# Patient Record
Sex: Female | Born: 1935 | Race: White | Hispanic: No | Smoking: Former smoker
Health system: Southern US, Community
[De-identification: ages and names within clinical notes are randomized; demographics above are authoritative.]

## PROBLEM LIST (undated history)

## (undated) DIAGNOSIS — C73 Malignant neoplasm of thyroid gland: Secondary | ICD-10-CM

## (undated) DIAGNOSIS — C50919 Malignant neoplasm of unspecified site of unspecified female breast: Secondary | ICD-10-CM

## (undated) HISTORY — PX: BREAST EXCISIONAL BIOPSY: SUR124

---

## 1990-08-25 DIAGNOSIS — C50919 Malignant neoplasm of unspecified site of unspecified female breast: Secondary | ICD-10-CM

## 1990-08-25 HISTORY — PX: MASTECTOMY: SHX3

## 1990-08-25 HISTORY — DX: Malignant neoplasm of unspecified site of unspecified female breast: C50.919

## 1998-12-19 ENCOUNTER — Ambulatory Visit (HOSPITAL_COMMUNITY): Admission: RE | Admit: 1998-12-19 | Discharge: 1998-12-19 | Payer: Self-pay | Admitting: Obstetrics & Gynecology

## 2004-10-23 ENCOUNTER — Ambulatory Visit: Payer: Self-pay | Admitting: General Surgery

## 2005-10-27 ENCOUNTER — Ambulatory Visit: Payer: Self-pay | Admitting: General Surgery

## 2006-09-15 ENCOUNTER — Other Ambulatory Visit: Payer: Self-pay

## 2006-09-15 ENCOUNTER — Ambulatory Visit: Payer: Self-pay | Admitting: Orthopaedic Surgery

## 2006-09-22 ENCOUNTER — Ambulatory Visit: Payer: Self-pay | Admitting: Orthopaedic Surgery

## 2006-10-28 ENCOUNTER — Ambulatory Visit: Payer: Self-pay | Admitting: General Surgery

## 2007-08-31 ENCOUNTER — Ambulatory Visit: Payer: Self-pay | Admitting: Unknown Physician Specialty

## 2007-10-29 ENCOUNTER — Ambulatory Visit: Payer: Self-pay | Admitting: General Surgery

## 2008-11-03 ENCOUNTER — Ambulatory Visit: Payer: Self-pay | Admitting: General Surgery

## 2009-10-17 ENCOUNTER — Ambulatory Visit: Payer: Self-pay | Admitting: Internal Medicine

## 2009-11-07 ENCOUNTER — Ambulatory Visit: Payer: Self-pay | Admitting: General Surgery

## 2009-12-05 ENCOUNTER — Ambulatory Visit: Payer: Self-pay | Admitting: Internal Medicine

## 2014-08-01 ENCOUNTER — Ambulatory Visit: Payer: Self-pay | Admitting: Family Medicine

## 2015-12-17 ENCOUNTER — Other Ambulatory Visit: Payer: Self-pay | Admitting: Family Medicine

## 2015-12-17 DIAGNOSIS — Z1231 Encounter for screening mammogram for malignant neoplasm of breast: Secondary | ICD-10-CM

## 2015-12-27 ENCOUNTER — Ambulatory Visit
Admission: RE | Admit: 2015-12-27 | Discharge: 2015-12-27 | Disposition: A | Discharge status: Home / Self Care | Payer: Medicare Other | Source: Ambulatory Visit | Attending: Family Medicine | Admitting: Family Medicine

## 2015-12-27 ENCOUNTER — Other Ambulatory Visit: Payer: Self-pay | Admitting: Family Medicine

## 2015-12-27 DIAGNOSIS — Z1231 Encounter for screening mammogram for malignant neoplasm of breast: Secondary | ICD-10-CM | POA: Diagnosis present

## 2015-12-27 HISTORY — DX: Malignant neoplasm of unspecified site of unspecified female breast: C50.919

## 2016-11-25 ENCOUNTER — Other Ambulatory Visit: Payer: Self-pay | Admitting: Family Medicine

## 2016-11-25 DIAGNOSIS — Z1231 Encounter for screening mammogram for malignant neoplasm of breast: Secondary | ICD-10-CM

## 2016-12-29 ENCOUNTER — Ambulatory Visit
Admission: RE | Admit: 2016-12-29 | Discharge: 2016-12-29 | Disposition: A | Discharge status: Home / Self Care | Payer: Medicare Other | Source: Ambulatory Visit | Attending: Family Medicine | Admitting: Family Medicine

## 2016-12-29 DIAGNOSIS — Z1231 Encounter for screening mammogram for malignant neoplasm of breast: Secondary | ICD-10-CM | POA: Diagnosis present

## 2018-02-15 ENCOUNTER — Other Ambulatory Visit: Payer: Self-pay | Admitting: Family Medicine

## 2018-02-15 DIAGNOSIS — Z1231 Encounter for screening mammogram for malignant neoplasm of breast: Secondary | ICD-10-CM

## 2018-03-12 ENCOUNTER — Ambulatory Visit
Admission: RE | Admit: 2018-03-12 | Discharge: 2018-03-12 | Disposition: A | Discharge status: Home / Self Care | Payer: Medicare Other | Source: Ambulatory Visit | Attending: Family Medicine | Admitting: Family Medicine

## 2018-03-12 DIAGNOSIS — Z1231 Encounter for screening mammogram for malignant neoplasm of breast: Secondary | ICD-10-CM | POA: Insufficient documentation

## 2018-05-18 ENCOUNTER — Ambulatory Visit: Payer: Medicare Other | Admitting: Internal Medicine

## 2019-02-22 ENCOUNTER — Other Ambulatory Visit: Payer: Self-pay | Admitting: Family Medicine

## 2019-02-22 DIAGNOSIS — Z1231 Encounter for screening mammogram for malignant neoplasm of breast: Secondary | ICD-10-CM

## 2019-04-04 ENCOUNTER — Other Ambulatory Visit: Payer: Self-pay

## 2019-04-04 ENCOUNTER — Ambulatory Visit
Admission: RE | Admit: 2019-04-04 | Discharge: 2019-04-04 | Disposition: A | Discharge status: Home / Self Care | Payer: Medicare Other | Source: Ambulatory Visit | Attending: Family Medicine | Admitting: Family Medicine

## 2019-04-04 DIAGNOSIS — Z1231 Encounter for screening mammogram for malignant neoplasm of breast: Secondary | ICD-10-CM | POA: Diagnosis present

## 2020-05-09 ENCOUNTER — Other Ambulatory Visit: Payer: Self-pay | Admitting: Family Medicine

## 2020-05-09 DIAGNOSIS — Z1231 Encounter for screening mammogram for malignant neoplasm of breast: Secondary | ICD-10-CM

## 2020-06-27 ENCOUNTER — Other Ambulatory Visit: Payer: Self-pay

## 2020-06-27 ENCOUNTER — Ambulatory Visit
Admission: RE | Admit: 2020-06-27 | Discharge: 2020-06-27 | Disposition: A | Discharge status: Home / Self Care | Payer: Medicare PPO | Source: Ambulatory Visit | Attending: Family Medicine | Admitting: Family Medicine

## 2020-06-27 DIAGNOSIS — Z1231 Encounter for screening mammogram for malignant neoplasm of breast: Secondary | ICD-10-CM | POA: Insufficient documentation

## 2021-06-11 ENCOUNTER — Other Ambulatory Visit: Payer: Self-pay | Admitting: Family Medicine

## 2021-06-11 DIAGNOSIS — Z1231 Encounter for screening mammogram for malignant neoplasm of breast: Secondary | ICD-10-CM

## 2021-07-03 ENCOUNTER — Other Ambulatory Visit: Payer: Self-pay

## 2021-07-03 ENCOUNTER — Ambulatory Visit
Admission: RE | Admit: 2021-07-03 | Discharge: 2021-07-03 | Disposition: A | Discharge status: Home / Self Care | Payer: Medicare PPO | Source: Ambulatory Visit | Attending: Family Medicine | Admitting: Family Medicine

## 2021-07-03 DIAGNOSIS — Z9011 Acquired absence of right breast and nipple: Secondary | ICD-10-CM | POA: Insufficient documentation

## 2021-07-03 DIAGNOSIS — Z1231 Encounter for screening mammogram for malignant neoplasm of breast: Secondary | ICD-10-CM | POA: Insufficient documentation

## 2021-07-03 DIAGNOSIS — Z853 Personal history of malignant neoplasm of breast: Secondary | ICD-10-CM | POA: Insufficient documentation

## 2021-09-18 ENCOUNTER — Encounter (INDEPENDENT_AMBULATORY_CARE_PROVIDER_SITE_OTHER): Payer: Self-pay

## 2021-09-25 ENCOUNTER — Other Ambulatory Visit: Payer: Self-pay

## 2021-09-25 ENCOUNTER — Ambulatory Visit (INDEPENDENT_AMBULATORY_CARE_PROVIDER_SITE_OTHER): Payer: Medicare PPO | Admitting: Ophthalmology

## 2021-09-25 ENCOUNTER — Encounter (INDEPENDENT_AMBULATORY_CARE_PROVIDER_SITE_OTHER): Payer: Self-pay | Admitting: Ophthalmology

## 2021-09-25 DIAGNOSIS — D3131 Benign neoplasm of right choroid: Secondary | ICD-10-CM

## 2021-09-25 DIAGNOSIS — H353131 Nonexudative age-related macular degeneration, bilateral, early dry stage: Secondary | ICD-10-CM | POA: Diagnosis not present

## 2021-09-25 NOTE — Progress Notes (Signed)
09/25/2021     CHIEF COMPLAINT Patient presents for  Chief Complaint  Patient presents with   Retina Evaluation    Evaluation for choroidal growth right eye  HISTORY OF PRESENT ILLNESS: Holly King is a 86 y.o. female who presents to the clinic today for:   HPI     Retina Evaluation           Laterality: left eye         Comments   NP- retinal consult, referred by Dr. Nelva Bush. Pt states "I had a check up for my Glaucoma and I mentioned I have noticed a change in vision. He said he saw a spot on my retina and he wanted me to have it checked out." Denies FOL or floaters. Pt uses latanoprost QHS OU.      Last edited by Laurin Coder on 09/25/2021  9:30 AM.      Referring physician: [place],  [place] [address]  HISTORICAL INFORMATION:   Selected notes from the [place]: No current outpatient medications on file. (Ophthalmic Drugs)   No current facility-administered medications for this visit. (Ophthalmic Drugs)   No current outpatient medications on file. (Other)   No current facility-administered medications for this visit. (Other)      REVIEW OF SYSTEMS:    ALLERGIES No Known Allergies  PAST MEDICAL HISTORY Past Medical History:  Diagnosis Date   Breast cancer ([place]) 1992   RIGHT MASTECTOMY   Past Surgical History:  Procedure Laterality Date   BREAST EXCISIONAL BIOPSY Left    NEG   MASTECTOMY Right 1992   BREAST CA    FAMILY HISTORY Family History  Problem Relation Age of Onset   Breast cancer Paternal Aunt     SOCIAL HISTORY Social History   Tobacco Use   Smoking status: Former    Types: Cigarettes    Passive exposure: Never   Smokeless tobacco: Never         OPHTHALMIC EXAM:  Base Eye Exam     Visual Acuity (ETDRS)       Right Left   Dist cc 20/40 -2 20/25 -1   Dist ph cc 20/30     Correction: Glasses          Tonometry (Tonopen, 9:33 AM)       Right Left   Pressure 14 19         Pupils       Pupils Dark Light Shape React APD   Right PERRL 4 3 Round Brisk None   Left PERRL 4 3 Round Brisk None         Visual Fields (Counting fingers)       Left Right    Full Full         Extraocular Movement       Right Left    Full Full         Neuro/Psych     Oriented x3: Yes   Mood/Affect: Normal         Dilation     Both eyes: 1.0% Mydriacyl, 2.5% Phenylephrine @ 9:33 AM           Slit Lamp and Fundus Exam     External Exam       Right Left   External Normal Normal         Slit Lamp Exam  Right Left   Lids/Lashes Normal Normal   Conjunctiva/Sclera White and quiet White and quiet   Cornea Clear Clear   Anterior Chamber Deep and quiet Deep and quiet   Iris Round and reactive Round and reactive   Lens Centered posterior chamber intraocular lens Centered posterior chamber intraocular lens   Anterior Vitreous Normal Normal         Fundus Exam       Right Left   Posterior Vitreous Normal Normal   Disc Normal Normal   C/D Ratio 0.35 0.35   Macula Normal Normal   Vessels Normal Normal   Periphery Amelanotic lesion superior at equator 12:00 meridian, approximately 4 x 3.5 DD diameter basal, with some thickness appreciable with 25 and 20 diopter lens evaluation Normal            IMAGING AND PROCEDURES  Imaging and Procedures for 09/25/21  OCT, Retina - OU - Both Eyes       Right Eye Quality was good. Scan locations included subfoveal. Central Foveal Thickness: 278. Progression has no prior data. Findings include retinal drusen .   Left Eye Quality was good. Scan locations included subfoveal. Central Foveal Thickness: 288. Progression has no prior data. Findings include retinal drusen .   Notes No active maculopathy in either eye     Color Fundus Photography Optos - OU - Both Eyes       Right Eye Progression has no prior data. Disc  findings include normal observations. Macula : normal observations. Vessels : normal observations.   Left Eye Progression has no prior data. Disc findings include normal observations. Macula : normal observations. Vessels : normal observations. Periphery : normal observations.   Notes OD, amelanotic lesion straddled the equator, approximately 4 x 4 disc diameters, no ring of atrophy no lipofuscin no vascularity seen on the surface of this area, possibly elevated     B-Scan Ultrasound - OD - Right Eye       Quality was good.   Notes B-scan ultrasound performed on lesion superiorly, no subretinal fluid  Thickness 1.54 mm             ASSESSMENT/PLAN:  Early stage nonexudative age-related macular degeneration of both eyes Minor OU, no active disease  Choroidal nevus of right eye Likely choroidal nevus of the right eye with no real high risk features, nonetheless patient does have a history of some 23 years before of breast cancer and we will monitor this closely for signs of growth and/or spread  Follow-up with Dr. Abran Richard off to have liver function test, chest x-ray and work-up to look for potential recurrence of that primary disease from over 20 years ago      ICD-10-CM   1. Choroidal nevus of right eye  D31.31 Color Fundus Photography Optos - OU - Both Eyes    B-Scan Ultrasound - OD - Right Eye    2. Early stage nonexudative age-related macular degeneration of both eyes  H35.3131 OCT, Retina - OU - Both Eyes      1.  OD likely choroidal nevus, new finding by Dr. Gwynn Burly.  We will follow this very closely because of the rare occurrences of high cellular doubling rates for rare tumors particularly primary ocular melanoma so thus not likely to happen also look for signs of change in the rare occurrence that this might be a new lesion from a distant primary tumor growth  2.  History of breast cancer over 20 years previously not likely to  be a recurrence but nonetheless needs  complete work-up  3.  Follow-up Dr. Abran Richard all for liver function tests, chest x-ray and appropriate studies to look for distant sites of involvement not likely to be found  Ophthalmic Meds Ordered this visit:  No orders of the defined types were placed in this encounter.      Return in about 8 weeks (around 11/20/2021) for dilate, OD, COLOR FP, OCT, B-Scan U/S.  There are no Patient Instructions on file for this visit.   Explained the diagnoses, plan, and follow up with the patient and they expressed understanding.  Patient expressed understanding of the importance of proper follow up care.   Holly King M.D. Diseases & Surgery of the Retina and Vitreous [facility] 09/25/21     Abbreviations: M myopia (nearsighted); A astigmatism; H hyperopia (farsighted); P presbyopia; Mrx spectacle prescription;  CTL contact lenses; OD right eye; OS left eye; OU both eyes  XT exotropia; ET esotropia; PEK punctate epithelial keratitis; PEE punctate epithelial erosions; DES dry eye syndrome; MGD meibomian gland dysfunction; ATs artificial tears; PFAT's preservative free artificial tears; [place] nuclear sclerotic cataract; PSC posterior subcapsular cataract; ERM epi-retinal membrane; PVD posterior vitreous detachment; RD retinal detachment; DM diabetes mellitus; DR diabetic retinopathy; NPDR non-proliferative diabetic retinopathy; PDR proliferative diabetic retinopathy; CSME clinically significant macular edema; DME diabetic macular edema; dbh dot blot hemorrhages; CWS cotton wool spot; POAG primary open angle glaucoma; C/D cup-to-disc ratio; HVF humphrey visual field; GVF goldmann visual field; OCT optical coherence tomography; IOP intraocular pressure; BRVO Branch retinal vein occlusion; CRVO central retinal vein occlusion; CRAO central retinal artery occlusion; BRAO branch retinal artery occlusion; RT retinal tear; SB scleral buckle; PPV pars plana vitrectomy; VH Vitreous hemorrhage; PRP  panretinal laser photocoagulation; IVK intravitreal kenalog; VMT vitreomacular traction; MH Macular hole;  NVD neovascularization of the disc; NVE neovascularization elsewhere; AREDS age related eye disease study; ARMD age related macular degeneration; POAG primary open angle glaucoma; EBMD epithelial/anterior basement membrane dystrophy; ACIOL anterior chamber intraocular lens; IOL intraocular lens; PCIOL posterior chamber intraocular lens; Phaco/IOL phacoemulsification with intraocular lens placement; [place] photorefractive keratectomy; LASIK laser assisted in situ keratomileusis; HTN hypertension; DM diabetes mellitus; COPD chronic obstructive pulmonary disease

## 2021-09-25 NOTE — Assessment & Plan Note (Signed)
Minor OU, no active disease

## 2021-09-25 NOTE — Assessment & Plan Note (Signed)
Likely choroidal nevus of the right eye with no real high risk features, nonetheless patient does have a history of some 23 years before of breast cancer and we will monitor this closely for signs of growth and/or spread  Follow-up with Dr. Abran Richard off to have liver function test, chest x-ray and work-up to look for potential recurrence of that primary disease from over 20 years ago

## 2021-11-13 LAB — COLOGUARD: COLOGUARD: POSITIVE — AB

## 2021-11-20 ENCOUNTER — Encounter (INDEPENDENT_AMBULATORY_CARE_PROVIDER_SITE_OTHER): Payer: Self-pay | Admitting: Ophthalmology

## 2021-11-20 ENCOUNTER — Ambulatory Visit (INDEPENDENT_AMBULATORY_CARE_PROVIDER_SITE_OTHER): Payer: Medicare PPO | Admitting: Ophthalmology

## 2021-11-20 ENCOUNTER — Other Ambulatory Visit: Payer: Self-pay

## 2021-11-20 DIAGNOSIS — D3131 Benign neoplasm of right choroid: Secondary | ICD-10-CM | POA: Diagnosis not present

## 2021-11-20 NOTE — Progress Notes (Signed)
? ? ?11/20/2021 ? ?  ? ?CHIEF COMPLAINT ?Patient presents for  ?Chief Complaint  ?Patient presents with  ? Retina Follow Up  ? ? ?For likely choroidal nevus superiorly OD nonetheless patient was sent for chest x-ray and liver function test to confirm no sign of spread of prior breast cancer disease ? ? ? ?HISTORY OF PRESENT ILLNESS: ?Holly King is a 86 y.o. female who presents to the clinic today for:  ? ?HPI   ? ? Retina Follow Up   ? ?      ? Diagnosis: Other (Choroidal Nevus )  ? Laterality: right eye  ? Onset: 8 weeks ago  ? Course: stable  ? ?  ?  ? ? Comments   ?8 weeks dilate OD, Color FP, OCT, B-Scan U/S. ?Pt states "vision is ok. It seems like it changes from day to day rather than being constant." Denies new FOL or floaters. ?Pt uses latanoprost QHS OU. ? ?  ?  ?Last edited by Laurin Coder on 11/20/2021 10:48 AM.  ?  ? ? ?Referring physician: ?Odette Fraction ?[address] ?[place],  [place] [address] ? ?HISTORICAL INFORMATION:  ? ?Selected notes from the [facility] ?  ?   ? ?CURRENT MEDICATIONS: ?Current Outpatient Medications (Ophthalmic Drugs)  ?Medication Sig  ? latanoprost (XALATAN) 0.005 % ophthalmic solution Place 1 drop into both eyes at bedtime.  ? ?No current facility-administered medications for this visit. (Ophthalmic Drugs)  ? ?No current outpatient medications on file. (Other)  ? ?No current facility-administered medications for this visit. (Other)  ? ? ? ? ?REVIEW OF SYSTEMS: ? ? ? ?ALLERGIES ?No Known Allergies ? ?PAST MEDICAL HISTORY ?Past Medical History:  ?Diagnosis Date  ? Breast cancer ([place]) 1992  ? RIGHT MASTECTOMY  ? ?Past Surgical History:  ?Procedure Laterality Date  ? BREAST EXCISIONAL BIOPSY Left   ? NEG  ? MASTECTOMY Right 1992  ? BREAST CA  ? ? ?FAMILY HISTORY ?Family History  ?Problem Relation Age of Onset  ? Breast cancer Paternal Aunt   ? ? ?SOCIAL HISTORY ?Social History  ? ?Tobacco Use  ? Smoking status: Former  ?  Types: Cigarettes  ?  Passive  exposure: Never  ? Smokeless tobacco: Never  ? ?  ? ?  ? ?OPHTHALMIC EXAM: ? ?Base Eye Exam   ? ? Visual Acuity (ETDRS)   ? ?   Right Left  ? Dist cc 20/25 -1 20/20 -2  ? ? Correction: Glasses  ? ?  ?  ? ? Tonometry (Tonopen, 10:51 AM)   ? ?   Right Left  ? Pressure 17 15  ? ?  ?  ? ? Pupils   ? ?   Pupils APD  ? Right PERRL None  ? Left PERRL None  ? ?  ?  ? ? Extraocular Movement   ? ?   Right Left  ?  Full Full  ? ?  ?  ? ? Neuro/Psych   ? ? Oriented x3: Yes  ? Mood/Affect: Normal  ? ?  ?  ? ? Dilation   ? ? Right eye: 1.0% Mydriacyl, 2.5% Phenylephrine @ 10:49 AM  ? ?  ?  ? ?  ? ?Slit Lamp and Fundus Exam   ? ? External Exam   ? ?   Right Left  ? External Normal Normal  ? ?  ?  ? ? Slit Lamp Exam   ? ?   Right Left  ?  Lids/Lashes Normal Normal  ? Conjunctiva/Sclera White and quiet White and quiet  ? Cornea Clear Clear  ? Anterior Chamber Deep and quiet Deep and quiet  ? Iris Round and reactive Round and reactive  ? Lens Centered posterior chamber intraocular lens Centered posterior chamber intraocular lens  ? Anterior Vitreous Normal Normal  ? ?  ?  ? ? Fundus Exam   ? ?   Right Left  ? Posterior Vitreous Normal Normal  ? Disc Normal Normal  ? C/D Ratio 0.35 0.35  ? Macula Normal Normal  ? Vessels Normal Normal  ? Periphery Amelanotic lesion superior at equator 12:00 meridian, approximately 4 x 3.5 DD diameter basal, with some thickness appreciable with 25 and 20 diopter lens evaluation Normal  ? ?  ?  ? ?  ? ? ?IMAGING AND PROCEDURES  ?Imaging and Procedures for 11/20/21 ? ?Color Fundus Photography Optos - OU - Both Eyes   ? ?   ?Right Eye ?Progression has no prior data. Disc findings include normal observations. Macula : normal observations. Vessels : normal observations.  ? ?Left Eye ?Progression has no prior data. Disc findings include normal observations. Macula : normal observations. Vessels : normal observations. Periphery : normal observations.  ? ?Notes ?OD, amelanotic lesion straddled the equator  superiorly, approximately 4 x 4 disc diameters, no ring of atrophy no lipofuscin no vascularity seen on the surface of this area, possibly elevated ? ?  ? ? ?  ?  ? ?  ?ASSESSMENT/PLAN: ? ?Choroidal nevus of right eye ?OD no interval change in appearance to this mostly amelanotic lesion which does appear to be a choroidal nevus in the itself has no high risk factors ? ?However patient does have a history of breast cancer.  Patient was sent for chest x-ray ? ?This was performed on October 30, 2021 at the [facility] sensitive out of [place] and my review of epic chart on this date, 11/20/2021 that "result is not available" ? ?Labs are available in view reviewable in the liver function tests were normal ? ?  ? ?  ICD-10-CM   ?1. Choroidal nevus of right eye  D31.31 Color Fundus Photography Optos - OU - Both Eyes  ?  CANCELED: OCT, Retina - OU - Both Eyes  ?  ? ? ?1.  We will attempt to follow-up on the chest x-ray results.  Both the patient and I will make efforts to follow-up with those results.  She will call her physician for those results and I will continue to look in the computer for such results for the chest x-ray. ? ?2.  Patient agrees she will did make her best efforts to contact her physician about the results as I also continue to monitor the computer for the result ? ?3.  Not likely at this is a and related to her prior breast cancer from 23 years previous ? ?Ophthalmic Meds Ordered this visit:  ?No orders of the defined types were placed in this encounter. ? ? ?  ? ?Return in about 6 months (around 05/23/2022) for dilate, OD, COLOR FP, B-Scan U/S. ? ?There are no Patient Instructions on file for this visit. ? ? ?Explained the diagnoses, plan, and follow up with the patient and they expressed understanding.  Patient expressed understanding of the importance of proper follow up care.  ? ?Clent Demark. Ainara Eldridge M.D. ?Diseases & Surgery of the Retina and Vitreous ?[place] ?11/20/21 ? ? ? ? ?Abbreviations: ?M myopia (  nearsighted); A astigmatism; H hyperopia (farsighted); P presbyopia; Mrx spectacle prescription;  CTL contact lenses; OD right eye; OS left eye; OU both eyes  XT exotropia; ET esotropia; PEK punctate epithelial keratitis; PEE punctate epithelial erosions; DES dry eye syndrome; MGD meibomian gland dysfunction; ATs artificial tears; PFAT's preservative free artificial tears; [place] nuclear sclerotic cataract; PSC posterior subcapsular cataract; ERM epi-retinal membrane; PVD posterior vitreous detachment; RD retinal detachment; DM diabetes mellitus; DR diabetic retinopathy; NPDR non-proliferative diabetic retinopathy; PDR proliferative diabetic retinopathy; CSME clinically significant macular edema; DME diabetic macular edema; dbh dot blot hemorrhages; CWS cotton wool spot; POAG primary open angle glaucoma; C/D cup-to-disc ratio; HVF humphrey visual field; GVF goldmann visual field; OCT optical coherence tomography; IOP intraocular pressure; BRVO Branch retinal vein occlusion; CRVO central retinal vein occlusion; CRAO central retinal artery occlusion; BRAO branch retinal artery occlusion; RT retinal tear; SB scleral buckle; PPV pars plana vitrectomy; VH Vitreous hemorrhage; PRP panretinal laser photocoagulation; IVK intravitreal kenalog; VMT vitreomacular traction; MH Macular hole;  NVD neovascularization of the disc; NVE neovascularization elsewhere; AREDS age related eye disease study; ARMD age related macular degeneration; POAG primary open angle glaucoma; EBMD epithelial/anterior basement membrane dystrophy; ACIOL anterior chamber intraocular lens; IOL intraocular lens; PCIOL posterior chamber intraocular lens; Phaco/IOL phacoemulsification with intraocular lens placement; [place] photorefractive keratectomy; LASIK laser assisted in situ keratomileusis; HTN hypertension; DM diabetes mellitus; COPD chronic obstructive pulmonary disease ?

## 2021-11-20 NOTE — Assessment & Plan Note (Signed)
OD no interval change in appearance to this mostly amelanotic lesion which does appear to be a choroidal nevus in the itself has no high risk factors ? ?However patient does have a history of breast cancer.  Patient was sent for chest x-ray ? ?This was performed on October 30, 2021 at the [facility] sensitive out of [place] and my review of epic chart on this date, 11/20/2021 that "result is not available" ? ?Labs are available in view reviewable in the liver function tests were normal ? ? ?

## 2022-04-07 ENCOUNTER — Ambulatory Visit: Payer: Self-pay | Admitting: Urology

## 2022-04-23 ENCOUNTER — Ambulatory Visit: Admit: 2022-04-23 | Payer: Medicare PPO | Admitting: Internal Medicine

## 2022-04-23 SURGERY — COLONOSCOPY
Anesthesia: General

## 2022-05-19 ENCOUNTER — Ambulatory Visit: Payer: Medicare PPO | Admitting: Urology

## 2022-05-19 VITALS — BP 213/74 | HR 80 | Ht 59.0 in | Wt 121.6 lb

## 2022-05-19 DIAGNOSIS — N3946 Mixed incontinence: Secondary | ICD-10-CM | POA: Diagnosis not present

## 2022-05-19 DIAGNOSIS — N3281 Overactive bladder: Secondary | ICD-10-CM | POA: Diagnosis not present

## 2022-05-19 DIAGNOSIS — N3941 Urge incontinence: Secondary | ICD-10-CM

## 2022-05-19 LAB — MICROSCOPIC EXAMINATION: Bacteria, UA: NONE SEEN

## 2022-05-19 LAB — URINALYSIS, COMPLETE
Bilirubin, UA: NEGATIVE
Glucose, UA: NEGATIVE
Ketones, UA: NEGATIVE
Leukocytes,UA: NEGATIVE
Nitrite, UA: NEGATIVE
Protein,UA: NEGATIVE
Specific Gravity, UA: 1.01 (ref 1.005–1.030)
Urobilinogen, Ur: 0.2 mg/dL (ref 0.2–1.0)
pH, UA: 5.5 (ref 5.0–7.5)

## 2022-05-19 MED ORDER — GEMTESA 75 MG PO TABS: 1.0000 | ORAL_TABLET | ORAL | 0 refills | Status: AC

## 2022-05-19 NOTE — Progress Notes (Signed)
   05/19/2022 9:18 AM   Holly King 04/05/1936 [id number]  Referring provider: Derinda Late, MD [phone] [facility] [place],  [place] [address]  Chief Complaint  Patient presents with   Urinary Incontinence    HPI: I was consulted to assess the patient's urinary incontinence.  She has urge incontinence.  She can leak with coughing sneezing and bending and lifting.  No bedwetting.  She wears 2 pads a day with a varying amount of leakage.  She can void every 20 minutes to 2 hours gets up once or twice a night reports a good flow  She has failed Myrbetriq and oxybutynin.  No hysterectomy  No history of kidney stones bladder surgery or bladder infections.  No neurologic issues.  Bowel movements normal.   PMH: Past Medical History:  Diagnosis Date   Breast cancer ([place]) 1992   RIGHT MASTECTOMY    Surgical History: Past Surgical History:  Procedure Laterality Date   BREAST EXCISIONAL BIOPSY Left    NEG   MASTECTOMY Right 1992   BREAST CA    Home Medications:  Allergies as of 05/19/2022   No Known Allergies      Medication List        Accurate as of May 19, 2022  9:18 AM. If you have any questions, ask your nurse or doctor.          latanoprost 0.005 % ophthalmic solution Commonly known as: XALATAN Place 1 drop into both eyes at bedtime.        Allergies: No Known Allergies  Family History: Family History  Problem Relation Age of Onset   Breast cancer Paternal Aunt     Social History:  reports that she has quit smoking. Her smoking use included cigarettes. She has never been exposed to tobacco smoke. She has never used smokeless tobacco. No history on file for alcohol use and drug use.  ROS:                                        Physical Exam: There were no vitals taken for this visit.  Constitutional:  Alert and oriented, No acute distress. HEENT: [place] AT,  moist mucus membranes.  Trachea midline, no masses.   Laboratory Data: No results found for: "WBC", "HGB", "HCT", "MCV", "PLT"  No results found for: "CREATININE"  No results found for: "PSA"  No results found for: "TESTOSTERONE"  No results found for: "HGBA1C"  Urinalysis No results found for: "COLORURINE", "APPEARANCEUR", "LABSPEC", "PHURINE", "GLUCOSEU", "HGBUR", "BILIRUBINUR", "KETONESUR", "PROTEINUR", "UROBILINOGEN", "NITRITE", "LEUKOCYTESUR"  Pertinent Imaging: Urine reviewed.  Urine sent for culture.  Chart reviewed  Assessment & Plan: Patient has mixed incontinence and primarily overactive bladder.  I would like to try Gemtesa 30x11 have her come back for cystoscopy.  She understands I would be ordering urodynamics if this fails.  I explained urodynamics to her today  1. Urge incontinence  - Urinalysis, Complete   No follow-ups on file.  Reece Packer, MD  [facility] [address] [place], [place] [address] [phone]

## 2022-05-22 LAB — CULTURE, URINE COMPREHENSIVE

## 2022-05-26 ENCOUNTER — Encounter (INDEPENDENT_AMBULATORY_CARE_PROVIDER_SITE_OTHER): Payer: Medicare PPO | Admitting: Ophthalmology

## 2022-06-02 ENCOUNTER — Encounter (INDEPENDENT_AMBULATORY_CARE_PROVIDER_SITE_OTHER): Payer: Medicare PPO | Admitting: Ophthalmology

## 2022-06-04 ENCOUNTER — Other Ambulatory Visit: Payer: Self-pay | Admitting: Family Medicine

## 2022-06-04 DIAGNOSIS — Z1231 Encounter for screening mammogram for malignant neoplasm of breast: Secondary | ICD-10-CM

## 2022-06-09 ENCOUNTER — Encounter (INDEPENDENT_AMBULATORY_CARE_PROVIDER_SITE_OTHER): Payer: Medicare PPO | Admitting: Ophthalmology

## 2022-06-30 ENCOUNTER — Ambulatory Visit: Payer: Medicare PPO | Admitting: Urology

## 2022-06-30 ENCOUNTER — Encounter: Payer: Self-pay | Admitting: Urology

## 2022-06-30 VITALS — BP 205/112 | HR 53 | Ht 59.0 in | Wt 125.0 lb

## 2022-06-30 DIAGNOSIS — R3129 Other microscopic hematuria: Secondary | ICD-10-CM

## 2022-06-30 DIAGNOSIS — N3941 Urge incontinence: Secondary | ICD-10-CM

## 2022-06-30 LAB — URINALYSIS, COMPLETE
Bilirubin, UA: NEGATIVE
Glucose, UA: NEGATIVE
Ketones, UA: NEGATIVE
Leukocytes,UA: NEGATIVE
Nitrite, UA: NEGATIVE
Protein,UA: NEGATIVE
Specific Gravity, UA: 1.01 (ref 1.005–1.030)
Urobilinogen, Ur: 0.2 mg/dL (ref 0.2–1.0)
pH, UA: 6 (ref 5.0–7.5)

## 2022-06-30 LAB — MICROSCOPIC EXAMINATION

## 2022-06-30 MED ORDER — GEMTESA 75 MG PO TABS: 75.0000 mg | ORAL_TABLET | Freq: Every day | ORAL | 3 refills | Status: DC

## 2022-06-30 NOTE — Progress Notes (Signed)
06/30/2022 9:07 AM   Galvin Proffer September 06, 1935 [id number]  Referring provider: Derinda Late, MD [phone] [facility] [place],  [place] [address]  Chief Complaint  Patient presents with   Urinary Incontinence    HPI: I was consulted to assess the patient's urinary incontinence.  She has urge incontinence.  She can leak with coughing sneezing and bending and lifting.  No bedwetting.  She wears 2 pads a day with a varying amount of leakage.   She can void every 20 minutes to 2 hours gets up once or twice a night reports a good flow   She has failed Myrbetriq and oxybutynin.   No hysterectomy    Patient has mixed incontinence and primarily overactive bladder.  I would like to try Gemtesa 30x11 have her come back for cystoscopy.  She understands I would be ordering urodynamics if this fails.  I explained urodynamics to her today   Today Frequency stable.  Last culture negative.  History was more challenging.  I think the [place] helps but she still has a lot of her urge incontinence.  She has never smoked.  No daily aspirin or blood thinner  Urinalysis positive red blood cells and sent for culture.  On pelvic examination she grade 2 hypermobility bladder neck and negative cough test after cystoscopy with a moderate cough  Cystoscopy: Patient underwent flexible cystoscopy.  Bladder mucosa and trigone were normal.  No carcinoma.  No cystitis  PMH: Past Medical History:  Diagnosis Date   Breast cancer ([place]) 1992   RIGHT MASTECTOMY    Surgical History: Past Surgical History:  Procedure Laterality Date   BREAST EXCISIONAL BIOPSY Left    NEG   MASTECTOMY Right 1992   BREAST CA    Home Medications:  Allergies as of 06/30/2022       Reactions   Alendronate Nausea Only        Medication List        Accurate as of June 30, 2022  9:07 AM. If you have any questions, ask your nurse or doctor.           amLODipine 2.5 MG tablet Commonly known as: NORVASC Take 2.5 mg by mouth daily.   gabapentin 300 MG capsule Commonly known as: NEURONTIN Take 1 capsule by mouth at bedtime.   latanoprost 0.005 % ophthalmic solution Commonly known as: XALATAN Place 1 drop into both eyes at bedtime.   levothyroxine 88 MCG tablet Commonly known as: SYNTHROID Take by mouth.   Myrbetriq 25 MG Tb24 tablet Generic drug: mirabegron ER Take by mouth.   TRAZODONE & DIET MANAGE PROD PO        Allergies:  Allergies  Allergen Reactions   Alendronate Nausea Only    Family History: Family History  Problem Relation Age of Onset   Breast cancer Paternal Aunt     Social History:  reports that she has quit smoking. Her smoking use included cigarettes. She has never been exposed to tobacco smoke. She has never used smokeless tobacco. No history on file for alcohol use and drug use.  ROS:                                        Physical Exam: There were no vitals taken for this visit.  Constitutional:  Alert and oriented, No acute distress.   Laboratory  Data: No results found for: "WBC", "HGB", "HCT", "MCV", "PLT"  No results found for: "CREATININE"  No results found for: "PSA"  No results found for: "TESTOSTERONE"  No results found for: "HGBA1C"  Urinalysis    Component Value Date/Time   APPEARANCEUR Clear 05/19/2022 0922   GLUCOSEU Negative 05/19/2022 0922   BILIRUBINUR Negative 05/19/2022 0922   PROTEINUR Negative 05/19/2022 0922   NITRITE Negative 05/19/2022 0922   LEUKOCYTESUR Negative 05/19/2022 0922    Pertinent Imaging: Urine reviewed.  Urine sent for culture.  Assessment & Plan: Patient has mixed incontinence and is a partial responder to [place]  She will return with urodynamics and we will proceed accordingly.  I will call if CT scan is abnormal for work-up of microscopic hematuria.  Call if urine culture is positive  1. Urge  incontinence  - Urinalysis, Complete   No follow-ups on file.  Reece Packer, MD  [facility] [address] [place], [place] [address] [phone]

## 2022-07-03 LAB — CULTURE, URINE COMPREHENSIVE

## 2022-07-03 NOTE — Addendum Note (Signed)
Addended by: Despina Hidden on: 07/03/2022 09:41 AM   Modules accepted: Orders

## 2022-07-04 ENCOUNTER — Ambulatory Visit
Admission: RE | Admit: 2022-07-04 | Discharge: 2022-07-04 | Disposition: A | Discharge status: Home / Self Care | Payer: Medicare PPO | Source: Ambulatory Visit | Attending: Family Medicine | Admitting: Family Medicine

## 2022-07-04 DIAGNOSIS — Z1231 Encounter for screening mammogram for malignant neoplasm of breast: Secondary | ICD-10-CM | POA: Diagnosis present

## 2022-07-30 ENCOUNTER — Ambulatory Visit: Payer: Medicare PPO | Admitting: Anesthesiology

## 2022-07-30 ENCOUNTER — Encounter: Payer: Self-pay | Admitting: Internal Medicine

## 2022-07-30 ENCOUNTER — Ambulatory Visit
Admission: RE | Admit: 2022-07-30 | Discharge: 2022-07-30 | Disposition: A | Discharge status: Home / Self Care | Payer: Medicare PPO | Attending: Internal Medicine | Admitting: Internal Medicine

## 2022-07-30 ENCOUNTER — Encounter: Payer: Self-pay | Admitting: Urology

## 2022-07-30 ENCOUNTER — Other Ambulatory Visit: Payer: Self-pay

## 2022-07-30 ENCOUNTER — Encounter
Admission: RE | Disposition: A | Discharge status: Home / Self Care | Payer: Self-pay | Source: Home / Self Care | Attending: Internal Medicine

## 2022-07-30 DIAGNOSIS — K640 First degree hemorrhoids: Secondary | ICD-10-CM | POA: Insufficient documentation

## 2022-07-30 DIAGNOSIS — K5730 Diverticulosis of large intestine without perforation or abscess without bleeding: Secondary | ICD-10-CM | POA: Diagnosis not present

## 2022-07-30 DIAGNOSIS — Z1211 Encounter for screening for malignant neoplasm of colon: Secondary | ICD-10-CM | POA: Diagnosis present

## 2022-07-30 DIAGNOSIS — Q438 Other specified congenital malformations of intestine: Secondary | ICD-10-CM | POA: Diagnosis not present

## 2022-07-30 DIAGNOSIS — R195 Other fecal abnormalities: Secondary | ICD-10-CM | POA: Diagnosis not present

## 2022-07-30 DIAGNOSIS — D175 Benign lipomatous neoplasm of intra-abdominal organs: Secondary | ICD-10-CM | POA: Insufficient documentation

## 2022-07-30 HISTORY — PX: COLONOSCOPY WITH PROPOFOL: SHX5780

## 2022-07-30 HISTORY — DX: Malignant neoplasm of thyroid gland: C73

## 2022-07-30 SURGERY — COLONOSCOPY WITH PROPOFOL
Anesthesia: General

## 2022-07-30 MED ORDER — SODIUM CHLORIDE 0.9 % IV SOLN: INTRAVENOUS | Status: DC

## 2022-07-30 MED ORDER — LIDOCAINE HCL (PF) 2 % IJ SOLN
INTRAMUSCULAR | Status: AC
  Filled 2022-07-30: qty 5

## 2022-07-30 MED ORDER — LIDOCAINE HCL (CARDIAC) PF 100 MG/5ML IV SOSY
PREFILLED_SYRINGE | INTRAVENOUS | Status: DC | PRN
  Administered 2022-07-30: 50 mg via INTRAVENOUS

## 2022-07-30 MED ORDER — HYDRALAZINE HCL 20 MG/ML IJ SOLN
INTRAMUSCULAR | Status: AC
  Filled 2022-07-30: qty 1

## 2022-07-30 MED ORDER — HYDRALAZINE HCL 20 MG/ML IJ SOLN
INTRAMUSCULAR | Status: DC | PRN
  Administered 2022-07-30: 5 mg via INTRAVENOUS

## 2022-07-30 MED ORDER — PROPOFOL 500 MG/50ML IV EMUL
INTRAVENOUS | Status: DC | PRN
  Administered 2022-07-30: 75 ug/kg/min via INTRAVENOUS

## 2022-07-30 MED ORDER — PROPOFOL 10 MG/ML IV BOLUS
INTRAVENOUS | Status: DC | PRN
  Administered 2022-07-30 (×2): 20 mg via INTRAVENOUS
  Administered 2022-07-30: 10 mg via INTRAVENOUS

## 2022-07-30 NOTE — H&P (Signed)
Outpatient short stay form Pre-procedure 07/30/2022 8:51 AM Holly King, M.D.  Primary Physician: Derinda Late, M.D.  Reason for visit:  Positive fecal dna test (Cologuard test).  History of present illness:  Holly King is a pleasant 86 year old female presenting with her daughter, Holly King, for gastroenterology consultation requested by Dr. Baldemar Lenis for positive fecal DNA test. Besides mild intermittent diarrhea with "loose stools", patient bowel frequency is only about 1/day. There are no nocturnal stools and no fecal urgency or incontinence. Patient denies any hematochezia. Patient has mild diffuse abdominal bloating without frank abdominal pain. Appetite is normal and weight is normal. Denies any significant gastroesophageal reflux symptoms such as waterbrash, retrosternal burning or regurgitation. Patient denies any anorexia symptoms. Patient's last colonoscopy was performed on 08/31/2007 by Dr. Gaylyn Cheers revealing sigmoid diverticulosis and internal hemorrhoids.    No current facility-administered medications for this encounter.  Medications Prior to Admission  Medication Sig Dispense Refill Last Dose   amLODipine (NORVASC) 2.5 MG tablet Take 2.5 mg by mouth daily.      gabapentin (NEURONTIN) 300 MG capsule Take 1 capsule by mouth at bedtime.      latanoprost (XALATAN) 0.005 % ophthalmic solution Place 1 drop into both eyes at bedtime.      levothyroxine (SYNTHROID) 88 MCG tablet Take by mouth.      TRAZODONE & DIET MANAGE PROD PO       Vibegron (GEMTESA) 75 MG TABS Take 75 mg by mouth daily. 30 tablet 3      Allergies  Allergen Reactions   Alendronate Nausea Only     Past Medical History:  Diagnosis Date   Breast cancer ([place]) 1992   RIGHT MASTECTOMY    Review of systems:  Otherwise negative.    Physical Exam  Gen: Alert, oriented. Appears stated age.  HEENT: [place]/AT. PERRLA. Lungs: CTA, no wheezes. CV: RR nl S1, S2. Abd: soft, benign, no masses. BS+ Ext:  No edema. Pulses 2+    Planned procedures: Proceed with colonoscopy. The patient understands the nature of the planned procedure, indications, risks, alternatives and potential complications including but not limited to bleeding, infection, perforation, damage to internal organs and possible oversedation/side effects from anesthesia. The patient agrees and gives consent to proceed.  Please refer to procedure notes for findings, recommendations and patient disposition/instructions.     Holly King, M.D. Gastroenterology 07/30/2022  8:51 AM

## 2022-07-30 NOTE — Anesthesia Postprocedure Evaluation (Signed)
Anesthesia Post Note  Patient: Holly King  Procedure(s) Performed: COLONOSCOPY WITH PROPOFOL  Patient location during evaluation: Endoscopy Anesthesia Type: General Level of consciousness: awake and alert Pain management: pain level controlled Vital Signs Assessment: post-procedure vital signs reviewed and stable Respiratory status: spontaneous breathing, nonlabored ventilation, respiratory function stable and patient connected to nasal cannula oxygen Cardiovascular status: blood pressure returned to baseline and stable Postop Assessment: no apparent nausea or vomiting Anesthetic complications: no  No notable events documented.   Last Vitals:  Vitals:   07/30/22 0958 07/30/22 1008  BP: 130/71 (!) 184/87  Pulse: (!) 50 64  Resp: (!) 21 16  Temp: (!) 36.2 C   SpO2: 100% 100%    Last Pain:  Vitals:   07/30/22 1008  TempSrc:   PainSc: 0-No pain                 Ilene Qua

## 2022-07-30 NOTE — Op Note (Signed)
[facility] Gastroenterology Patient Name: Holly King Procedure Date: 07/30/2022 9:31 AM MRN: [id number] Account #: [id number] Date of Birth: Dec 07, 1935 Admit Type: Outpatient Age: 86 Room: [facility] ENDO ROOM 2 Gender: Female Note Status: Finalized Instrument Name: Colonoscope [id number] Procedure:             Colonoscopy Indications:           Positive Cologuard test Providers:             Benay Pike. Alice Reichert MD, MD Referring MD:          Caprice Renshaw MD (Referring MD) Medicines:             Propofol per Anesthesia Complications:         No immediate complications. Procedure:             Pre-Anesthesia Assessment:                        - The risks and benefits of the procedure and the                         sedation options and risks were discussed with the                         patient. All questions were answered and informed                         consent was obtained.                        - Patient identification and proposed procedure were                         verified prior to the procedure by the nurse. The                         procedure was verified in the procedure room.                        - ASA Grade Assessment: III - A patient with severe                         systemic disease.                        - After reviewing the risks and benefits, the patient                         was deemed in satisfactory condition to undergo the                         procedure.                        After obtaining informed consent, the colonoscope was                         passed under direct vision. Throughout the procedure,                         the patient's  blood pressure, pulse, and oxygen                         saturations were monitored continuously. The                         Colonoscope was introduced through the anus and                         advanced to the the cecum, identified by appendiceal                          orifice and ileocecal valve. The colonoscopy was                         somewhat difficult due to a redundant colon.                         Successful completion of the procedure was aided by                         straightening and shortening the scope to obtain bowel                         loop reduction. The patient tolerated the procedure                         well. The quality of the bowel preparation was good.                         The ileocecal valve, appendiceal orifice, and rectum                         were photographed. Findings:      The perianal and digital rectal examinations were normal. Pertinent       negatives include normal sphincter tone and no palpable rectal lesions.      Non-bleeding internal hemorrhoids were found during retroflexion. The       hemorrhoids were Grade I (internal hemorrhoids that do not prolapse).      Multiple diverticula were found in the sigmoid colon. There was no       evidence of diverticular bleeding.      There was a small lipoma, in the cecum. Biopsies were taken with a cold       forceps for histology.      The exam was otherwise without abnormality. Impression:            - Non-bleeding internal hemorrhoids.                        - Moderate diverticulosis in the sigmoid colon. There                         was no evidence of diverticular bleeding.                        - Small lipoma in the cecum. Biopsied.                        -  The examination was otherwise normal. Recommendation:        - Patient has a contact number available for                         emergencies. The signs and symptoms of potential                         delayed complications were discussed with the patient.                         Return to normal activities tomorrow. Written                         discharge instructions were provided to the patient.                        - Resume previous diet.                        - Continue present  medications.                        - Await pathology results.                        - No repeat colonoscopy due to current age (44 years                         or older) and the absence of advanced adenomas.                        - Return to GI office PRN.                        - You do NOT require further colon cancer screening                         measures (Annual stool testing (i.e. hemoccult, FIT,                         cologuard), sigmoidoscopy, colonoscopy or CT                         colonography). You should share this recommendation                         with your Primary Care provider.                        - The findings and recommendations were discussed with                         the patient. Procedure Code(s):     --- Professional ---                        [phone], Colonoscopy, flexible; with biopsy, single or                         multiple Diagnosis Code(s):     --- Professional ---  K57.30, Diverticulosis of large intestine without                         perforation or abscess without bleeding                        R19.5, Other fecal abnormalities                        D17.5, Benign lipomatous neoplasm of intra-abdominal                         organs                        K64.0, First degree hemorrhoids CPT copyright 2022 American Medical Association. All rights reserved. The codes documented in this report are preliminary and upon coder review may  be revised to meet current compliance requirements. Efrain Sella MD, MD 07/30/2022 10:01:08 AM This report has been signed electronically. Number of Addenda: 0 Note Initiated On: 07/30/2022 9:31 AM Scope Withdrawal Time: 0 hours 5 minutes 13 seconds  Total Procedure Duration: 0 hours 13 minutes 43 seconds  Estimated Blood Loss:  Estimated blood loss: none.      [facility]

## 2022-07-30 NOTE — Transfer of Care (Signed)
Immediate Anesthesia Transfer of Care Note  Patient: Tailey Top  Procedure(s) Performed: COLONOSCOPY WITH PROPOFOL  Patient Location: PACU  Anesthesia Type:General  Level of Consciousness: sedated  Airway & Oxygen Therapy: Patient Spontanous Breathing  Post-op Assessment: Report given to RN and Post -op Vital signs reviewed and stable  Post vital signs: Reviewed and stable  Last Vitals:  Vitals Value Taken Time  BP 130/71 07/30/22 0958  Temp 36.2 C 07/30/22 0958  Pulse 49 07/30/22 0958  Resp 19 07/30/22 0958  SpO2 100 % 07/30/22 0958  Vitals shown include unvalidated device data.  Last Pain:  Vitals:   07/30/22 0958  TempSrc: Temporal  PainSc: Asleep         Complications: No notable events documented.

## 2022-07-30 NOTE — Anesthesia Preprocedure Evaluation (Signed)
Anesthesia Evaluation  Patient identified by MRN, date of birth, ID band Patient awake    Reviewed: Allergy & Precautions, NPO status , Patient's Chart, lab work & pertinent test results  History of Anesthesia Complications Negative for: history of anesthetic complications  Airway Mallampati: II  TM Distance: >3 FB Neck ROM: full    Dental  (+) Teeth Intact   Pulmonary neg pulmonary ROS, former smoker   Pulmonary exam normal        Cardiovascular hypertension, On Medications Normal cardiovascular exam     Neuro/Psych negative neurological ROS  negative psych ROS   GI/Hepatic negative GI ROS, Neg liver ROS,,,  Endo/Other  Hypothyroidism    Renal/GU negative Renal ROS  negative genitourinary   Musculoskeletal   Abdominal   Peds  Hematology negative hematology ROS (+)   Anesthesia Other Findings Past Medical History: 1992: Breast cancer ([place])     Comment:  RIGHT MASTECTOMY  Past Surgical History: No date: BREAST EXCISIONAL BIOPSY; Left     Comment:  NEG 1992: MASTECTOMY; Right     Comment:  BREAST CA     Reproductive/Obstetrics negative OB ROS                              Anesthesia Physical Anesthesia Plan  ASA: 2  Anesthesia Plan: General   Post-op Pain Management: Minimal or no pain anticipated   Induction:   PONV Risk Score and Plan: Propofol infusion and TIVA  Airway Management Planned:   Additional Equipment:   Intra-op Plan:   Post-operative Plan:   Informed Consent: I have reviewed the patients History and Physical, chart, labs and discussed the procedure including the risks, benefits and alternatives for the proposed anesthesia with the patient or authorized representative who has indicated his/her understanding and acceptance.     Dental Advisory Given  Plan Discussed with: Anesthesiologist, CRNA and Surgeon  Anesthesia Plan Comments:           Anesthesia Quick Evaluation

## 2022-07-30 NOTE — Interval H&P Note (Signed)
History and Physical Interval Note:  07/30/2022 8:52 AM  Holly King  has presented today for surgery, with the diagnosis of R19.5  - Positive colorectal cancer screening using DNA-based stool test.  The various methods of treatment have been discussed with the patient and family. After consideration of risks, benefits and other options for treatment, the patient has consented to  Procedure(s): COLONOSCOPY WITH PROPOFOL (N/A) as a surgical intervention.  The patient's history has been reviewed, patient examined, no change in status, stable for surgery.  I have reviewed the patient's chart and labs.  Questions were answered to the patient's satisfaction.     [place], [place]

## 2022-07-31 ENCOUNTER — Encounter: Payer: Self-pay | Admitting: Internal Medicine

## 2022-07-31 LAB — SURGICAL PATHOLOGY

## 2022-08-06 ENCOUNTER — Ambulatory Visit
Admission: RE | Admit: 2022-08-06 | Discharge: 2022-08-06 | Disposition: A | Discharge status: Home / Self Care | Payer: Medicare PPO | Source: Ambulatory Visit | Attending: Urology | Admitting: Urology

## 2022-08-06 DIAGNOSIS — R3129 Other microscopic hematuria: Secondary | ICD-10-CM | POA: Insufficient documentation

## 2022-08-06 LAB — POCT I-STAT CREATININE: Creatinine, Ser: 1 mg/dL (ref 0.44–1.00)

## 2022-08-06 MED ORDER — IOHEXOL 300 MG/ML  SOLN
100.0000 mL | Freq: Once | INTRAMUSCULAR | Status: AC | PRN
  Administered 2022-08-06: 100 mL via INTRAVENOUS

## 2022-08-07 ENCOUNTER — Telehealth: Payer: Self-pay | Admitting: *Deleted

## 2022-08-07 DIAGNOSIS — R9389 Abnormal findings on diagnostic imaging of other specified body structures: Secondary | ICD-10-CM

## 2022-08-07 NOTE — Telephone Encounter (Signed)
Spoke with patient and advised results Referral entered

## 2022-08-07 NOTE — Telephone Encounter (Signed)
-----   Message from Bjorn Loser, MD sent at 08/07/2022  3:16 PM EST ----- Please call the patient and let her know that the CT scan of her kidneys is normal other than a small stone in her right kidney that does not need any treatment  Please tell her that she had some thickening of the inner aspect of the uterus which was a bit abnormal for her age and she should see a gynecologist.  Please set up this referral to gynecology.  Please let me know if there are any issues.  I will continue to see the patient for her incontinence ----- Message ----- From: Despina Hidden, CMA Sent: 08/07/2022   2:21 PM EST To: Bjorn Loser, MD   ----- Message ----- From: Interface, Rad Results In Sent: 08/07/2022  12:40 PM EST To: Rowe [facility] Clinical

## 2022-08-11 ENCOUNTER — Ambulatory Visit: Payer: Medicare PPO | Admitting: Urology

## 2022-08-11 VITALS — BP 189/78 | HR 60 | Wt 121.0 lb

## 2022-08-11 DIAGNOSIS — N3946 Mixed incontinence: Secondary | ICD-10-CM | POA: Diagnosis not present

## 2022-08-11 DIAGNOSIS — N3941 Urge incontinence: Secondary | ICD-10-CM

## 2022-08-11 NOTE — Progress Notes (Signed)
08/11/2022 11:00 AM   Holly King 01/06/1936 [id number]  Referring provider: Derinda Late, MD [phone] [facility] [place],  [place] [address]  Chief Complaint  Patient presents with   Follow-up    HPI: I was consulted to assess the patient's urinary incontinence.  She has urge incontinence.  She can leak with coughing sneezing and bending and lifting.  No bedwetting.  She wears 2 pads a day with a varying amount of leakage.   She can void every 20 minutes to 2 hours gets up once or twice a night reports a good flow   She has failed Myrbetriq and oxybutynin.   No hysterectomy     Patient has mixed incontinence and primarily overactive bladder.  I would like to try Gemtesa 30x11 have her come back for cystoscopy.  She understands I would be ordering urodynamics if this fails.  I explained urodynamics to her today    Today Frequency stable.  Last culture negative.  History was more challenging.  I think the [place] helps but she still has a lot of her urge incontinence.  She has never smoked.  No daily aspirin or blood thinner   Urinalysis positive red blood cells and sent for culture.   On pelvic examination she grade 2 hypermobility bladder neck and negative cough test after cystoscopy with a moderate cough   Cystoscopy: Patient underwent flexible cystoscopy.  Bladder mucosa and trigone were normal.  No carcinoma.  No cystitis    Patient has mixed incontinence and is a partial responder to [place] She will return with urodynamics and we will proceed accordingly. I will call if CT scan is abnormal for work-up of microscopic hematuria. Call if urine culture is positive   Today Patient has a nonobstructive stone superior pole of right kidney.  She has thickening of the inner lining of the uterus with some fluid in the endometrial cavity.  She has dilation of the left ovarian vein  On urodynamics patient did not void and  was catheterized for 75 mL.  Maximum bladder capacity was 450 mL.  Bladder was unstable reaching pressures of 50 cm of water.  She felt urgency and leaked a small amount.  She had no stress incontinence with a Valsalva pressure of 88 cm of water.  During voluntary voiding she voided 50 mL with a maximal flow of 1 mill per second.  Max voiding pressure is 10 cm of water.  She then voided the remaining 400 mL in the private restroom and her residual was 0 mL.  Bladder neck descent at less than a centimeter.  The details of the urodynamics are signed dictated  Patient has been called about her CT scan and has an appoint with a gynecologist. I went over with her and her family 3 refractory treatments with full templates and gave out handouts.  She wants to think about it.  She understands that I would do the tests in [place] but we do the implant locally.  She has a friend that had InterStim.  Based upon age percutaneous tibial nerve stimulation may be a very good option for her.    PMH: Past Medical History:  Diagnosis Date   Breast cancer ([place]) 1992   RIGHT MASTECTOMY   Thyroid cancer [facility])     Surgical History: Past Surgical History:  Procedure Laterality Date   BREAST EXCISIONAL BIOPSY Left    NEG   COLONOSCOPY WITH PROPOFOL N/A 07/30/2022  Procedure: COLONOSCOPY WITH PROPOFOL;  Surgeon: [place], Benay Pike, MD;  Location: [facility] ENDOSCOPY;  Service: Gastroenterology;  Laterality: N/A;   MASTECTOMY Right 1992   BREAST CA    Home Medications:  Allergies as of 08/11/2022       Reactions   Alendronate Nausea Only        Medication List        Accurate as of August 11, 2022 11:00 AM. If you have any questions, ask your nurse or doctor.          amLODipine 2.5 MG tablet Commonly known as: NORVASC Take 2.5 mg by mouth daily.   gabapentin 300 MG capsule Commonly known as: NEURONTIN Take 1 capsule by mouth at bedtime.   Gemtesa 75 MG Tabs Generic drug: Vibegron Take 75  mg by mouth daily.   latanoprost 0.005 % ophthalmic solution Commonly known as: XALATAN Place 1 drop into both eyes at bedtime.   levothyroxine 88 MCG tablet Commonly known as: SYNTHROID Take by mouth.   TRAZODONE & DIET MANAGE PROD PO        Allergies:  Allergies  Allergen Reactions   Alendronate Nausea Only    Family History: Family History  Problem Relation Age of Onset   Breast cancer Paternal Aunt     Social History:  reports that she has quit smoking. Her smoking use included cigarettes. She has never been exposed to tobacco smoke. She has never used smokeless tobacco. No history on file for alcohol use and drug use.  ROS:                                        Physical Exam: There were no vitals taken for this visit.  Constitutional:  Alert and oriented, No acute distress. HEENT: [place] AT, moist mucus membranes.  Trachea midline, no masses.  Laboratory Data: No results found for: "WBC", "HGB", "HCT", "MCV", "PLT"  Lab Results  Component Value Date   CREATININE 1.00 08/06/2022    No results found for: "PSA"  No results found for: "TESTOSTERONE"  No results found for: "HGBA1C"  Urinalysis    Component Value Date/Time   APPEARANCEUR Clear 06/30/2022 0912   GLUCOSEU Negative 06/30/2022 0912   BILIRUBINUR Negative 06/30/2022 0912   PROTEINUR Negative 06/30/2022 0912   NITRITE Negative 06/30/2022 0912   LEUKOCYTESUR Negative 06/30/2022 0912    Pertinent Imaging:   Assessment & Plan: Patient has mixed incontinence but primarily refractory overactive bladder.  She will call dependent on which treatment she chooses.  There are no diagnoses linked to this encounter.  No follow-ups on file.  Reece Packer, MD  [place] [address], [place], [place] [address] [phone]

## 2022-09-03 ENCOUNTER — Telehealth: Payer: Self-pay | Admitting: Urology

## 2022-09-03 DIAGNOSIS — N3941 Urge incontinence: Secondary | ICD-10-CM

## 2022-09-03 NOTE — Telephone Encounter (Signed)
Pt said Dr Matilde Sprang had mentioned BOTOX to her in the past and she's interested in finding out more info.

## 2022-09-03 NOTE — Telephone Encounter (Signed)
Spoke with patient and she would like to proceed with BOTOX, ok to proceed

## 2022-09-10 ENCOUNTER — Encounter: Payer: Self-pay | Admitting: Obstetrics & Gynecology

## 2022-09-10 ENCOUNTER — Ambulatory Visit: Payer: Medicare PPO | Admitting: Obstetrics & Gynecology

## 2022-09-10 VITALS — BP 120/80 | Ht 59.0 in | Wt 123.0 lb

## 2022-09-10 DIAGNOSIS — N859 Noninflammatory disorder of uterus, unspecified: Secondary | ICD-10-CM | POA: Diagnosis not present

## 2022-09-10 DIAGNOSIS — R9389 Abnormal findings on diagnostic imaging of other specified body structures: Secondary | ICD-10-CM | POA: Diagnosis not present

## 2022-09-10 MED ORDER — MISOPROSTOL 200 MCG PO TABS: ORAL_TABLET | ORAL | 0 refills | Status: DC

## 2022-09-10 NOTE — Progress Notes (Signed)
   Established Patient Office Visit  Subjective   Patient ID: Holly King, female    DOB: 09-Jun-1936  Age: 87 y.o. MRN: [id number]  Chief Complaint  Patient presents with   Annual Exam    HPI   87 yo P2 here for a consult regarding CT findings as follows:   Fluid within the endometrial cavity and/or endometrial thickening, abnormal in the postmenopausal setting. Recommend pelvic ultrasound to further evaluate. She had this CT done as evaluation of microscopic hematuria. She denies PMB.  Objective:     BP 120/80   Ht '4\' 11"'$  (1.499 m)   Wt 123 lb (55.8 kg)   BMI 24.84 kg/m    Physical Exam   Well nourished, well hydrated White female, no apparent distress She is ambulating and conversing normally.  Assessment & Plan:  Thickened endometrium/endometrial fluid in a an 87 yo woman I offered to do [facility] today verus pretreatment with cytotec. She opts for pretreatment. I will work her in Monday for the [facility].   Problem List Items Addressed This Visit   None     Emily Filbert, MD

## 2022-09-15 ENCOUNTER — Ambulatory Visit: Payer: Medicare PPO | Admitting: Obstetrics & Gynecology

## 2022-09-15 ENCOUNTER — Other Ambulatory Visit (HOSPITAL_COMMUNITY)
Admission: RE | Admit: 2022-09-15 | Discharge: 2022-09-15 | Disposition: A | Discharge status: Home / Self Care | Payer: Medicare PPO | Source: Ambulatory Visit | Attending: Obstetrics & Gynecology | Admitting: Obstetrics & Gynecology

## 2022-09-15 ENCOUNTER — Encounter: Payer: Self-pay | Admitting: Obstetrics & Gynecology

## 2022-09-15 VITALS — BP 182/69 | HR 59 | Resp 16 | Ht 59.0 in | Wt 123.9 lb

## 2022-09-15 DIAGNOSIS — R9389 Abnormal findings on diagnostic imaging of other specified body structures: Secondary | ICD-10-CM | POA: Diagnosis not present

## 2022-09-15 DIAGNOSIS — N859 Noninflammatory disorder of uterus, unspecified: Secondary | ICD-10-CM | POA: Diagnosis present

## 2022-09-15 NOTE — Progress Notes (Signed)
    GYNECOLOGY PROGRESS NOTE  Subjective:    Patient ID: Holly King, female    DOB: 27-Aug-1935, 87 y.o.   MRN: [id number]  HPI  Patient is a 87 y.o. G68P2002 female who presents for evaluation of thickened endometrium/endometrial fluid. She took cytotec last night in preparation for the biopsy.  The following portions of the patient's history were reviewed and updated as appropriate: allergies, current medications, past family history, past medical history, past social history, past surgical history, and problem list.  Review of Systems Pertinent items noted in HPI and remainder of comprehensive ROS otherwise negative.   Objective:   Blood pressure (!) 182/69, pulse (!) 59, resp. rate 16, height '4\' 11"'$  (1.499 m), weight 123 lb 14.4 oz (56.2 kg). Body mass index is 25.02 kg/m. Well nourished, well hydrated White female, no apparent distress She is ambulating and conversing normally.  Consent signed, time out done Cervix prepped with betadine and sprayed with Hurricaine gel. I then grasped with a single tooth tenaculum. A cervical dilator was required to open her stenotic cervix. Uterus sounded to 8 cm Pipelle used for 2 passes. Each time, a viscous brown liquid was obtained. On the third pass, there was no more fluid noted She tolerated the procedure well.     Assessment:   1. Thickened endometrium   2. Fluid in endometrial cavity      Plan:   I reminded her that the purpose of the biopsy is to rule out endometrial/uterine cancer. I told her that if the biopsy is malignant, then I will refer her to a gyn oncologist.  Emily Filbert, MD [place]

## 2022-09-15 NOTE — Progress Notes (Signed)
    GYNECOLOGY PROGRESS NOTE  Subjective:    Patient ID: Holly King, female    DOB: 05/16/1936, 87 y.o.   MRN: [id number]  HPI  Patient is a 87 y.o. G51P2002 female who presents for evaluation of thickened endometrium/endometrial fluid. She took cytotec last night in preparation for the biopsy.  The following portions of the patient's history were reviewed and updated as appropriate: allergies, current medications, past family history, past medical history, past social history, past surgical history, and problem list.  Review of Systems Pertinent items noted in HPI and remainder of comprehensive ROS otherwise negative.   Objective:   There were no vitals taken for this visit. There is no height or weight on file to calculate BMI. Well nourished, well hydrated White female, no apparent distress She is ambulating and conversing normally.  Consent signed, time out done Cervix prepped with betadine and sprayed with Hurricaine gel. I then grasped with a single tooth tenaculum. A cervical dilator was required to open her stenotic cervix. Uterus sounded to 8 cm Pipelle used for 2 passes. Each time, a viscous brown liquid was obtained. On the third pass, there was no more fluid noted She tolerated the procedure well.     Assessment:   1. Thickened endometrium   2. Fluid in endometrial cavity      Plan:   I reminded her that the purpose of the biopsy is to rule out endometrial/uterine cancer. I told her that if the biopsy is malignant, then I will refer her to a gyn oncologist.  Emily Filbert, MD [place]

## 2022-09-16 ENCOUNTER — Encounter: Payer: Self-pay | Admitting: Obstetrics & Gynecology

## 2022-09-17 LAB — SURGICAL PATHOLOGY

## 2022-09-19 ENCOUNTER — Encounter: Payer: Self-pay | Admitting: Obstetrics & Gynecology

## 2022-09-23 NOTE — Telephone Encounter (Signed)
Patient called to follow up on Botox, advised patient we have the request and it will be worked on. And we will let her know as soon as we have more information

## 2022-09-25 NOTE — Telephone Encounter (Signed)
Sent benefit verification to [facility], waiting on authorization.

## 2022-09-29 ENCOUNTER — Encounter: Payer: Self-pay | Admitting: Obstetrics & Gynecology

## 2022-09-30 NOTE — Telephone Encounter (Signed)
Auth came back requesting prior auth for Botox, form faxed to [facility].

## 2022-10-01 NOTE — Addendum Note (Signed)
Addended by: Despina Hidden on: 10/01/2022 05:00 PM   Modules accepted: Orders

## 2022-10-01 NOTE — Telephone Encounter (Signed)
Spoke with patient and advised that she is approved for BOTOX with a $40.00 co-pay. Scheduled pt to come in 10/02/22 for UA/UCX for botox. Once results come back and are clear I will schedule BOTOX.  I will place order for BOTOX 100 units with jessica   Botox verification and prior auth scanned in patient's chart.  Approval number [id number]  Dates 10/01/2022 -08/25/2023

## 2022-10-02 ENCOUNTER — Other Ambulatory Visit: Payer: Medicare PPO

## 2022-10-02 DIAGNOSIS — N3941 Urge incontinence: Secondary | ICD-10-CM

## 2022-10-03 LAB — URINALYSIS, COMPLETE
Bilirubin, UA: NEGATIVE
Glucose, UA: NEGATIVE
Ketones, UA: NEGATIVE
Nitrite, UA: NEGATIVE
Protein,UA: NEGATIVE
Specific Gravity, UA: 1.025 (ref 1.005–1.030)
Urobilinogen, Ur: 1 mg/dL (ref 0.2–1.0)
pH, UA: 6 (ref 5.0–7.5)

## 2022-10-03 LAB — MICROSCOPIC EXAMINATION

## 2022-10-06 NOTE — Telephone Encounter (Signed)
Left message on home and cell VM asking pt to return my call.

## 2022-10-07 LAB — CULTURE, URINE COMPREHENSIVE

## 2022-10-08 NOTE — Telephone Encounter (Addendum)
Bjorn Loser, MD  Despina Hidden, CMA Keflex 500 mg tid for 7 days   .left message to have patient return my call.

## 2022-10-09 MED ORDER — CEPHALEXIN 500 MG PO CAPS: 500.0000 mg | ORAL_CAPSULE | Freq: Three times a day (TID) | ORAL | 0 refills | Status: AC

## 2022-10-09 NOTE — Telephone Encounter (Signed)
Spoke with patient and advised results rx sent to pharmacy by e-script  

## 2022-10-09 NOTE — Addendum Note (Signed)
Addended by: Despina Hidden on: 10/09/2022 03:20 PM   Modules accepted: Orders

## 2022-10-17 ENCOUNTER — Telehealth: Payer: Self-pay | Admitting: Family Medicine

## 2022-10-17 NOTE — Telephone Encounter (Signed)
Patient called and states she has finished her ABX and would like to have the Botox now.

## 2022-10-21 NOTE — Telephone Encounter (Signed)
.  left message to have patient return my call.  

## 2022-10-28 NOTE — Telephone Encounter (Signed)
Called and left message on both cell and home number. Pt is already scheduled for Botox, I do need to confirm and send in medication for her.

## 2022-10-28 NOTE — Telephone Encounter (Signed)
Patient LM on triage line for a return call. She will have her cell phone. [phone]

## 2022-10-29 MED ORDER — CIPROFLOXACIN HCL 500 MG PO TABS: ORAL_TABLET | ORAL | 0 refills | Status: DC

## 2022-10-29 NOTE — Addendum Note (Signed)
Addended by: Despina Hidden on: 10/29/2022 10:49 AM   Modules accepted: Orders

## 2022-10-29 NOTE — Telephone Encounter (Signed)
rx sent to pharmacy by e-script  

## 2022-10-29 NOTE — Telephone Encounter (Signed)
Talked with patient and told her about the medication she needs to take before botox ,

## 2022-11-17 ENCOUNTER — Encounter: Payer: Self-pay | Admitting: Urology

## 2022-11-17 ENCOUNTER — Ambulatory Visit: Payer: Medicare PPO | Admitting: Urology

## 2022-11-17 VITALS — BP 159/84 | HR 61 | Ht 59.0 in | Wt 123.0 lb

## 2022-11-17 DIAGNOSIS — N3941 Urge incontinence: Secondary | ICD-10-CM

## 2022-11-17 LAB — URINALYSIS, COMPLETE
Bilirubin, UA: NEGATIVE
Glucose, UA: NEGATIVE
Leukocytes,UA: NEGATIVE
Nitrite, UA: NEGATIVE
Protein,UA: NEGATIVE
Specific Gravity, UA: >1.03 — ABNORMAL HIGH (ref 1.005–1.030)
Urobilinogen, Ur: 1 mg/dL (ref 0.2–1.0)
pH, UA: 7 (ref 5.0–7.5)

## 2022-11-17 LAB — MICROSCOPIC EXAMINATION

## 2022-11-17 MED ORDER — ONABOTULINUMTOXINA 100 UNITS IJ SOLR
100.0000 [IU] | Freq: Once | INTRAMUSCULAR | Status: AC
  Administered 2022-11-17: 100 [IU] via INTRAMUSCULAR

## 2022-11-17 NOTE — Progress Notes (Signed)
11/17/2022 1:42 PM   Holly King 1936-03-29 [id number]  Referring provider: Derinda Late, MD [phone] [facility] [place],   [address]  Chief Complaint  Patient presents with   Botulinum Toxin Injection    HPI: I was consulted to assess the patient's urinary incontinence.  She has urge incontinence.  She can leak with coughing sneezing and bending and lifting.  No bedwetting.  She wears 2 pads a day with a varying amount of leakage.   She can void every 20 minutes to 2 hours gets up once or twice a night reports a good flow   She has failed Myrbetriq and oxybutynin.   No hysterectomy     Patient has mixed incontinence and primarily overactive bladder.  I would like to try Gemtesa 30x11 have her come back for cystoscopy.  She understands I would be ordering urodynamics if this fails.  I explained urodynamics to her today    Today Frequency stable.  Last culture negative.  History was more challenging.  I think the [place] helps but she still has a lot of her urge incontinence.  She has never smoked.  No daily aspirin or blood thinner   Urinalysis positive red blood cells and sent for culture.   On pelvic examination she grade 2 hypermobility bladder neck and negative cough test after cystoscopy with a moderate cough   Cystoscopy: Patient underwent flexible cystoscopy.  Bladder mucosa and trigone were normal.  No carcinoma.  No cystitis     Patient has mixed incontinence and is a partial responder to [place] She will return with urodynamics and we will proceed accordingly. I will call if CT scan is abnormal for work-up of microscopic hematuria. Call if urine culture is positive    Today Patient has a nonobstructive stone superior pole of right kidney.  She has thickening of the inner lining of the uterus with some fluid in the endometrial cavity.  She has dilation of the left ovarian vein   On urodynamics  patient did not void and was catheterized for 75 mL.  Maximum bladder capacity was 450 mL.  Bladder was unstable reaching pressures of 50 cm of water.  She felt urgency and leaked a small amount.  She had no stress incontinence with a Valsalva pressure of 88 cm of water.  During voluntary voiding she voided 50 mL with a maximal flow of 1 mill per second.  Max voiding pressure is 10 cm of water.  She then voided the remaining 400 mL in the private restroom and her residual was 0 mL.  Bladder neck descent at less than a centimeter.     Patient has been called about her CT scan and has an appoint with a gynecologist. I went over with her and her family 3 refractory treatments with full templates and gave out handouts.  She wants to think about it.  She understands that I would do the tests in [place] but we do the implant locally.  She has a friend that had InterStim.  Based upon age percutaneous tibial nerve stimulation may be a very good option for her.    Patient has mixed incontinence but primarily refractory overactive bladder. She will call dependent on which treatment she chooses.   Today Frequency stable.  Incontinence stable.  Clinically not infected-was given Keflex in the past for low count Streptococcus and urine.  Cultures prior negative.  Clinically not infected today  Cystoscopy and Botox.  Patient underwent flexible cystoscopy.  Bladder mucosa and trigone were normal.  No cystitis.  Urine clear.  I injected 100 units of Botox and 10 cc in normal saline in the lower third of the bladder.  She tolerated beautifully.  Minimal bleeding.    PMH: Past Medical History:  Diagnosis Date   Breast cancer ([place]) 1992   RIGHT MASTECTOMY   Thyroid cancer [facility])     Surgical History: Past Surgical History:  Procedure Laterality Date   BREAST EXCISIONAL BIOPSY Left    NEG   COLONOSCOPY WITH PROPOFOL N/A 07/30/2022   Procedure: COLONOSCOPY WITH PROPOFOL;  Surgeon: Toledo, Benay Pike, MD;   Location: [facility] ENDOSCOPY;  Service: Gastroenterology;  Laterality: N/A;   MASTECTOMY Right 1992   BREAST CA    Home Medications:  Allergies as of 11/17/2022       Reactions   Alendronate Nausea Only        Medication List        Accurate as of November 17, 2022  1:42 PM. If you have any questions, ask your nurse or doctor.          amLODipine 2.5 MG tablet Commonly known as: NORVASC Take 2.5 mg by mouth daily.   ciprofloxacin 500 MG tablet Commonly known as: CIPRO Take 1 tablet the day before Botox, 1 tablet the day of Botox and 1 tablet the day after Botox.   gabapentin 300 MG capsule Commonly known as: NEURONTIN Take 1 capsule by mouth at bedtime.   latanoprost 0.005 % ophthalmic solution Commonly known as: XALATAN Place 1 drop into both eyes at bedtime.   levothyroxine 88 MCG tablet Commonly known as: SYNTHROID Take by mouth.   losartan-hydrochlorothiazide 50-12.5 MG tablet Commonly known as: HYZAAR Take 1 tablet by mouth daily.   traZODone 50 MG tablet Commonly known as: DESYREL Take 50 mg by mouth at bedtime.        Allergies:  Allergies  Allergen Reactions   Alendronate Nausea Only    Family History: Family History  Problem Relation Age of Onset   Breast cancer Paternal Aunt     Social History:  reports that she has quit smoking. Her smoking use included cigarettes. She has never been exposed to tobacco smoke. She has never used smokeless tobacco. She reports current alcohol use. She reports that she does not use drugs.  ROS:                                        Physical Exam: There were no vitals taken for this visit.  Constitutional:  Alert and oriented, No acute distress. HEENT: [place] AT, moist mucus membranes.  Trachea midline, no masses.  Laboratory Data: No results found for: "WBC", "HGB", "HCT", "MCV", "PLT"  Lab Results  Component Value Date   CREATININE 1.00 08/06/2022    No results found for:  "PSA"  No results found for: "TESTOSTERONE"  No results found for: "HGBA1C"  Urinalysis    Component Value Date/Time   APPEARANCEUR Clear 10/02/2022 1412   GLUCOSEU Negative 10/02/2022 1412   BILIRUBINUR Negative 10/02/2022 1412   PROTEINUR Negative 10/02/2022 1412   NITRITE Negative 10/02/2022 1412   LEUKOCYTESUR Trace (A) 10/02/2022 1412    Pertinent Imaging:   Assessment & Plan: Follow as per protocol  1. Urgency incontinence  - BLADDER SCAN AMB NON-IMAGING - botulinum toxin Type A (BOTOX) injection 100 Units -  Urinalysis, Complete   No follow-ups on file.  Reece Packer, MD  [facility] [address], [place], [place] [address] [phone]

## 2022-12-02 ENCOUNTER — Ambulatory Visit: Payer: Medicare PPO | Admitting: Physician Assistant

## 2022-12-02 VITALS — BP 164/84 | HR 60 | Ht 59.0 in | Wt 123.0 lb

## 2022-12-02 DIAGNOSIS — N3941 Urge incontinence: Secondary | ICD-10-CM | POA: Diagnosis not present

## 2022-12-02 LAB — BLADDER SCAN AMB NON-IMAGING: Scan Result: 0

## 2022-12-02 NOTE — Progress Notes (Signed)
12/02/2022 3:55 PM   Holly King May 21, 1936 [id number]  CC: Chief Complaint  Patient presents with   Follow-up    HPI: Holly King is a 87 y.o. female with PMH OAB wet who underwent intravesical Botox with Dr. Sherron Monday 15 days ago who presents today for follow-up.   Today she reports significant improvement in her urinary symptoms including resolution of urinary incontinence after Botox.  She is incredibly pleased with her progress.  PVR 0 mL.  She was unable to provide a urine specimen today, but denies dysuria.  PMH: Past Medical History:  Diagnosis Date   Breast cancer ([facility]) 1992   RIGHT MASTECTOMY   Thyroid cancer [facility])     Surgical History: Past Surgical History:  Procedure Laterality Date   BREAST EXCISIONAL BIOPSY Left    NEG   COLONOSCOPY WITH PROPOFOL N/A 07/30/2022   Procedure: COLONOSCOPY WITH PROPOFOL;  Surgeon: Toledo, Boykin Nearing, MD;  Location: [facility] ENDOSCOPY;  Service: Gastroenterology;  Laterality: N/A;   MASTECTOMY Right 1992   BREAST CA    Home Medications:  Allergies as of 12/02/2022       Reactions   Alendronate Nausea Only        Medication List        Accurate as of December 02, 2022  3:55 PM. If you have any questions, ask your nurse or doctor.          amLODipine 2.5 MG tablet Commonly known as: NORVASC Take 2.5 mg by mouth daily.   ciprofloxacin 500 MG tablet Commonly known as: CIPRO Take 1 tablet the day before Botox, 1 tablet the day of Botox and 1 tablet the day after Botox.   gabapentin 300 MG capsule Commonly known as: NEURONTIN Take 1 capsule by mouth at bedtime.   latanoprost 0.005 % ophthalmic solution Commonly known as: XALATAN Place 1 drop into both eyes at bedtime.   levothyroxine 88 MCG tablet Commonly known as: SYNTHROID Take by mouth.   losartan-hydrochlorothiazide 50-12.5 MG tablet Commonly known as: HYZAAR Take 1 tablet by mouth daily.   traZODone 50 MG tablet Commonly known  as: DESYREL Take 50 mg by mouth at bedtime.        Allergies:  Allergies  Allergen Reactions   Alendronate Nausea Only    Family History: Family History  Problem Relation Age of Onset   Breast cancer Paternal Aunt     Social History:   reports that she has quit smoking. Her smoking use included cigarettes. She has never been exposed to tobacco smoke. She has never used smokeless tobacco. She reports current alcohol use. She reports that she does not use drugs.  Physical Exam: BP (!) 164/84   Pulse 60   Ht 4\' 11"  (1.499 m)   Wt 123 lb (55.8 kg)   BMI 24.84 kg/m   Constitutional:  Alert and oriented, no acute distress, nontoxic appearing HEENT: [place], AT Cardiovascular: No clubbing, cyanosis, or edema Respiratory: Normal respiratory effort, no increased work of breathing Skin: No rashes, bruises or suspicious lesions Neurologic: Grossly intact, no focal deficits, moving all 4 extremities Psychiatric: Normal mood and affect  Laboratory Data: Results for orders placed or performed in visit on 12/02/22  BLADDER SCAN AMB NON-IMAGING  Result Value Ref Range   Scan Result 0 ml    Assessment & Plan:   1. Urgency incontinence Significant improvement following intravesical Botox.  She is emptying appropriately today and denies irritative voiding symptoms.  She will call our clinic to  schedule her next treatment once the medication wears off. - BLADDER SCAN AMB NON-IMAGING  Return for Patient to call when ready to schedule next Botox.  Carman Ching, PA-C  [facility] [place] [address], [address] [place], [place] [address] [phone]

## 2023-01-26 IMAGING — MG DIGITAL SCREENING UNILAT LEFT W/ TOMO W/ CAD
4 series · 4 of 12 positions shown · non-contrast
Comparison: Previous exam(s).

CLINICAL DATA: Screening.

EXAM:
DIGITAL SCREENING UNILATERAL LEFT MAMMOGRAM WITH CAD AND
TOMOSYNTHESIS
TECHNIQUE: Left screening digital craniocaudal and mediolateral oblique
mammograms were obtained. Left screening digital breast
tomosynthesis was performed. The images were evaluated with
computer-aided detection.

[L MLO synth-2D]
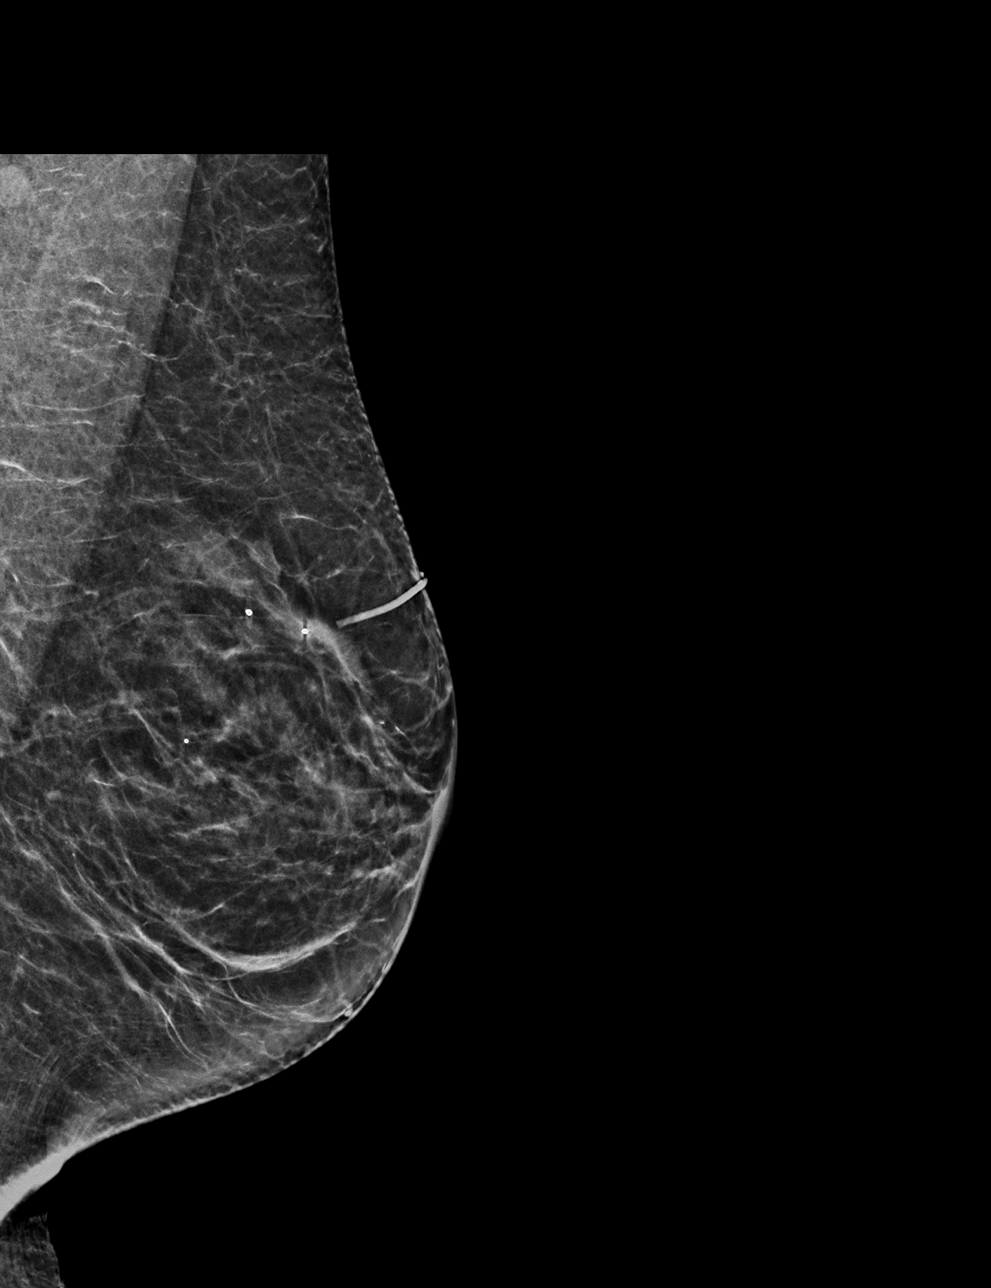

[L CC synth-2D]
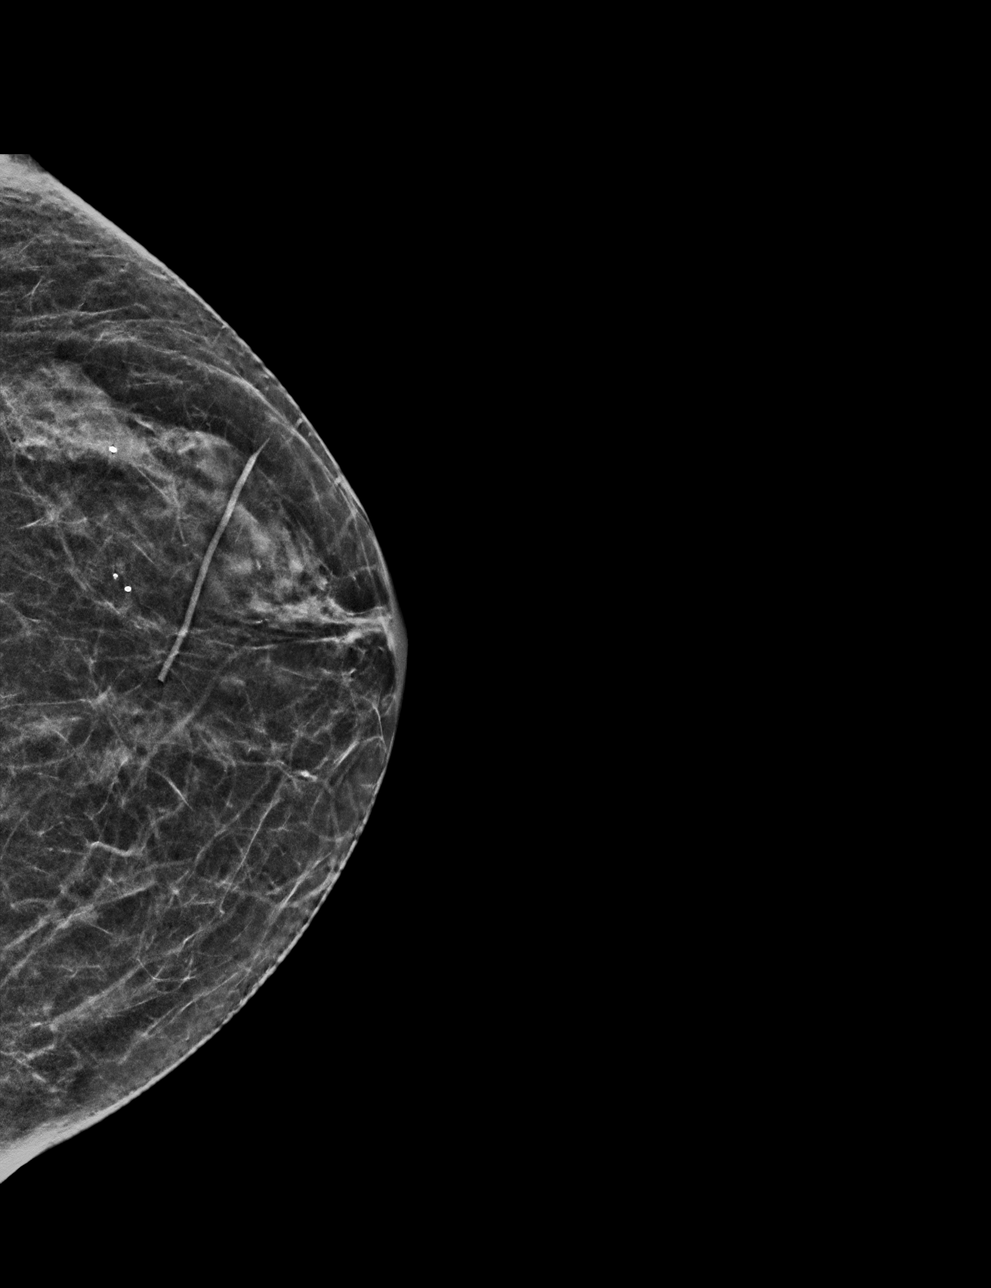

[L MLO tomo · tomo slice 27/52.0]
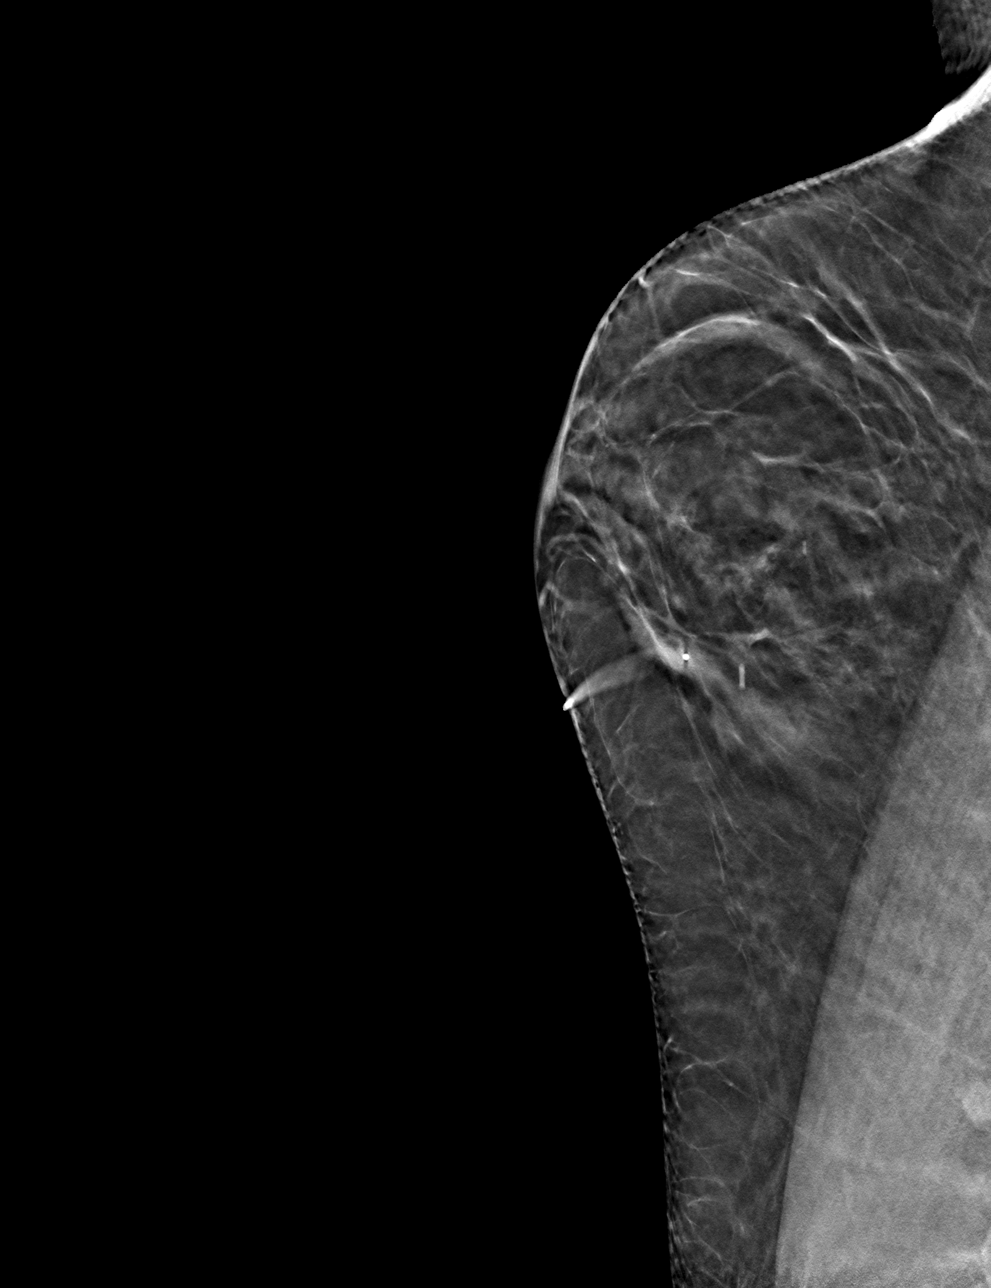

[L CC tomo · tomo slice 24/47.0]
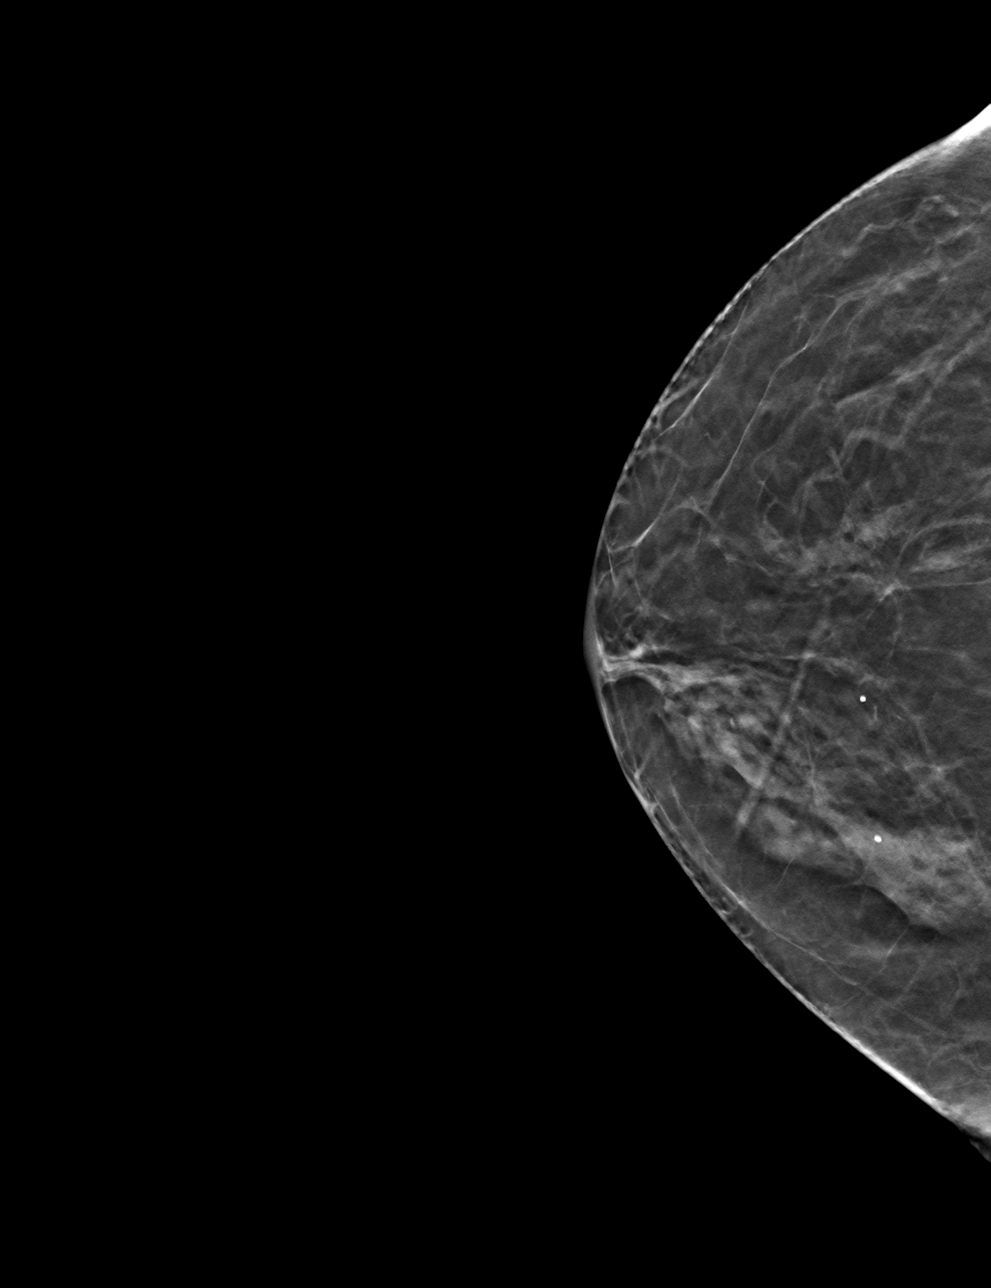

[4 of 12 positions shown; findings below may reference images not displayed]

ACR Breast Density Category b: There are scattered areas of
fibroglandular density.
FINDINGS: The patient has had a right mastectomy. There are no findings
suspicious for malignancy.
IMPRESSION: No mammographic evidence of malignancy. A result letter of this
screening mammogram will be mailed directly to the patient.

RECOMMENDATION:
Screening mammogram in one year.  (Code:QW-1-27W)

BI-RADS CATEGORY  1: Negative.

## 2023-06-19 ENCOUNTER — Other Ambulatory Visit: Payer: Self-pay | Admitting: Family Medicine

## 2023-06-19 DIAGNOSIS — Z1231 Encounter for screening mammogram for malignant neoplasm of breast: Secondary | ICD-10-CM

## 2023-07-13 ENCOUNTER — Ambulatory Visit
Admission: RE | Admit: 2023-07-13 | Discharge: 2023-07-13 | Disposition: A | Discharge status: Home / Self Care | Payer: Medicare PPO | Source: Ambulatory Visit | Attending: Family Medicine | Admitting: Family Medicine

## 2023-07-13 DIAGNOSIS — Z1231 Encounter for screening mammogram for malignant neoplasm of breast: Secondary | ICD-10-CM | POA: Insufficient documentation

## 2023-07-30 ENCOUNTER — Telehealth: Payer: Self-pay | Admitting: Urology

## 2023-07-30 NOTE — Telephone Encounter (Signed)
Patient called and lvm requesting to schedule next Botox appointment. There is an approval in her chart from 08/26/23-08/24/24. Just wanted to confirm it is ok to schedule.

## 2023-08-04 NOTE — Telephone Encounter (Signed)
Spoke with patient and advised I would schedule and send in medication in January.

## 2023-09-01 MED ORDER — CIPROFLOXACIN HCL 500 MG PO TABS: ORAL_TABLET | ORAL | 0 refills | Status: DC

## 2023-09-01 NOTE — Telephone Encounter (Signed)
 Spoke with patient and advised results Botox scheduled Lab scheduled  Cipro sent in

## 2023-09-01 NOTE — Telephone Encounter (Signed)
.  left message on cell and home to have patient return my call.  

## 2023-09-01 NOTE — Addendum Note (Signed)
 Addended by: Sueanne Margarita on: 09/01/2023 04:27 PM   Modules accepted: Orders

## 2023-09-01 NOTE — Telephone Encounter (Signed)
 Patient called today and lvm wanting to schedule Botox appointment. Please advise.

## 2023-09-28 ENCOUNTER — Other Ambulatory Visit: Payer: Self-pay

## 2023-09-28 DIAGNOSIS — N3946 Mixed incontinence: Secondary | ICD-10-CM

## 2023-09-29 ENCOUNTER — Other Ambulatory Visit: Payer: Medicare PPO

## 2023-09-29 DIAGNOSIS — N3946 Mixed incontinence: Secondary | ICD-10-CM

## 2023-09-29 LAB — URINALYSIS, COMPLETE
Bilirubin, UA: NEGATIVE
Glucose, UA: NEGATIVE
Ketones, UA: NEGATIVE
Leukocytes,UA: NEGATIVE
Nitrite, UA: NEGATIVE
Protein,UA: NEGATIVE
Specific Gravity, UA: 1.015 (ref 1.005–1.030)
Urobilinogen, Ur: 0.2 mg/dL (ref 0.2–1.0)
pH, UA: 6 (ref 5.0–7.5)

## 2023-09-29 LAB — MICROSCOPIC EXAMINATION

## 2023-10-02 LAB — CULTURE, URINE COMPREHENSIVE

## 2023-10-06 ENCOUNTER — Telehealth: Payer: Self-pay | Admitting: Pharmacy Technician

## 2023-10-06 NOTE — Telephone Encounter (Signed)
Auth Submission: APPROVED Site of care: [facility] Payer: [employer] MEDICARE Medication & CPT/J Code(s) submitted: BOTOX [id number] Route of submission (phone, fax, portal):  Phone # Fax # Auth type: Buy/Bill PB Units/visits requested:  Reference number: [id number] Approval from: 10/01/22 to 08/24/24

## 2023-10-12 ENCOUNTER — Ambulatory Visit: Payer: Medicare PPO | Admitting: Urology

## 2023-10-12 VITALS — BP 184/74 | HR 60

## 2023-10-12 DIAGNOSIS — N3946 Mixed incontinence: Secondary | ICD-10-CM | POA: Diagnosis not present

## 2023-10-12 DIAGNOSIS — N3941 Urge incontinence: Secondary | ICD-10-CM

## 2023-10-12 MED ORDER — ONABOTULINUMTOXINA 100 UNITS IJ SOLR
100.0000 [IU] | Freq: Once | INTRAMUSCULAR | Status: AC
  Administered 2023-10-12: 100 [IU] via INTRAMUSCULAR

## 2023-10-12 NOTE — Progress Notes (Signed)
 10/12/2023 11:40 AM   Holly King Dec 30, 1935 [id number]  Referring provider: Kandyce Rud, MD [phone] [facility] [place],  [place] [address]  Chief Complaint  Patient presents with   Procedure    Botox injections     HPI: Reviewed chart.  Patient has had Streptococcus in the urine treated with Keflex in the past.  She has mixed incontinence but primarily refractory overactive bladder.  Her last Botox was in March 2024.  She did very well and empty efficiently afterwards.  Recent urine culture negative  Clinically not infected.  Took her ciprofloxacin.  She had more than 6 months of excellent results from the Botox.  Frequency stable.  Urge incontinence returned  Cystoscopy: Patient underwent flexible cystoscopy.  Bladder mucosa and trigone were normal.  No cystitis.  No carcinoma.  Clinically not infected.  I injected 100 units of Botox and 10 cc of normal saline with my modified template with more injections in the middle but also in the lower half of the bladder.  She tolerated well.  No bleeding   PMH: Past Medical History:  Diagnosis Date   Breast cancer ([facility]) 1992   RIGHT MASTECTOMY   Thyroid cancer [facility])     Surgical History: Past Surgical History:  Procedure Laterality Date   BREAST EXCISIONAL BIOPSY Left    NEG   COLONOSCOPY WITH PROPOFOL N/A 07/30/2022   Procedure: COLONOSCOPY WITH PROPOFOL;  Surgeon: Toledo, Boykin Nearing, MD;  Location: [facility] ENDOSCOPY;  Service: Gastroenterology;  Laterality: N/A;   MASTECTOMY Right 1992   BREAST CA    Home Medications:  Allergies as of 10/12/2023       Reactions   Alendronate Nausea Only        Medication List        Accurate as of October 12, 2023 11:40 AM. If you have any questions, ask your nurse or doctor.          STOP taking these medications    losartan-hydrochlorothiazide 50-12.5 MG tablet Commonly known as: HYZAAR       TAKE these  medications    amLODipine 2.5 MG tablet Commonly known as: NORVASC Take 2.5 mg by mouth daily.   ciprofloxacin 500 MG tablet Commonly known as: CIPRO Take 1 tablet the day before Botox, 1 tablet the day of Botox and 1 tablet the day after Botox.   gabapentin 300 MG capsule Commonly known as: NEURONTIN Take 1 capsule by mouth at bedtime.   latanoprost 0.005 % ophthalmic solution Commonly known as: XALATAN Place 1 drop into both eyes at bedtime.   levothyroxine 88 MCG tablet Commonly known as: SYNTHROID Take by mouth.   nortriptyline 25 MG capsule Commonly known as: PAMELOR Take by mouth.   traZODone 50 MG tablet Commonly known as: DESYREL Take 50 mg by mouth at bedtime.        Allergies:  Allergies  Allergen Reactions   Alendronate Nausea Only    Family History: Family History  Problem Relation Age of Onset   Breast cancer Paternal Aunt     Social History:  reports that she has quit smoking. Her smoking use included cigarettes. She has never been exposed to tobacco smoke. She has never used smokeless tobacco. She reports current alcohol use. She reports that she does not use drugs.  ROS:  Physical Exam: BP (!) 184/74   Pulse 60   Constitutional:  Alert and oriented, No acute distress. HEENT:  AT, moist mucus membranes.  Trachea midline, no masses.   Laboratory Data: No results found for: "WBC", "HGB", "HCT", "MCV", "PLT"  Lab Results  Component Value Date   CREATININE 1.00 08/06/2022    No results found for: "PSA"  No results found for: "TESTOSTERONE"  No results found for: "HGBA1C"  Urinalysis    Component Value Date/Time   APPEARANCEUR Clear 09/29/2023 1017   GLUCOSEU Negative 09/29/2023 1017   BILIRUBINUR Negative 09/29/2023 1017   PROTEINUR Negative 09/29/2023 1017   NITRITE Negative 09/29/2023 1017   LEUKOCYTESUR Negative 09/29/2023 1017    Pertinent  Imaging:   Assessment & Plan: Follow-up as per protocol.  She did very well with Botox  1. Mixed incontinence (Primary)  - Urinalysis, Complete  2. Urgency incontinence  - Urinalysis, Complete   No follow-ups on file.  Holly Sinner, MD  [facility] [address] [place], [place] [address] [phone]

## 2023-10-12 NOTE — Progress Notes (Signed)
 Bladder Instillation  patient is present today for a Bladder Instillation of 50 ml 1% lidocaine. Patient was cleaned and prepped in a sterile fashion with betadine and lidocaine 2% jelly was instilled into the urethra.  A 14FR catheter was inserted, urine return was noted 100 ml, urine was clear yellow  in color.  50  ml was instilled into the bladder. The catheter was then removed. Patient tolerated well, no complications were noted. Patient held in bladder for 30 minutes prior to procedure starting.   Performed by: Randa Lynn, RMA

## 2023-10-28 ENCOUNTER — Telehealth: Payer: Self-pay

## 2023-10-28 ENCOUNTER — Encounter: Payer: Self-pay | Admitting: Physician Assistant

## 2023-10-28 ENCOUNTER — Ambulatory Visit: Payer: Medicare PPO | Admitting: Physician Assistant

## 2023-10-28 VITALS — BP 148/84 | HR 89 | Resp 16 | Ht 59.0 in | Wt 123.6 lb

## 2023-10-28 DIAGNOSIS — N3941 Urge incontinence: Secondary | ICD-10-CM | POA: Diagnosis not present

## 2023-10-28 LAB — MICROSCOPIC EXAMINATION: Epithelial Cells (non renal): >10 /HPF — AB (ref 0–10)

## 2023-10-28 LAB — URINALYSIS, COMPLETE
Bilirubin, UA: NEGATIVE
Glucose, UA: NEGATIVE
Ketones, UA: NEGATIVE
Nitrite, UA: NEGATIVE
Protein,UA: NEGATIVE
RBC, UA: NEGATIVE
Specific Gravity, UA: 1.02 (ref 1.005–1.030)
Urobilinogen, Ur: 0.2 mg/dL (ref 0.2–1.0)
pH, UA: 6.5 (ref 5.0–7.5)

## 2023-10-28 LAB — BLADDER SCAN AMB NON-IMAGING: Scan Result: 0

## 2023-10-28 NOTE — Telephone Encounter (Signed)
 Last Botox done on 10/12/2023, next due after 04/10/2024. Botox 100 units. PA Done and approved  10/02/23 to 08/24/24.  Needs an appointment for UA and urine Cultur and then Appt for Botox as requested for patient.  Once scheduled, need to Let Shanda Bumps know to order medication closer to the scheduled appointment time.

## 2023-10-28 NOTE — Telephone Encounter (Signed)
-----   Message from [facility] sent at 10/28/2023 11:59 AM EST ----- Regarding: Botox Hi all,  She'd like to get the ball rolling on her next Botox, preferably in the [place] timeframe.  Thanks! Sam

## 2023-10-28 NOTE — Progress Notes (Signed)
 10/28/2023 11:45 AM   Holly King 1936/06/02 [id number]  CC: Chief Complaint  Patient presents with   Follow-up    PVR   HPI: Holly King is a 88 y.o. female with PMH OAB with mixed urge and stress incontinence who underwent intravesical Botox with Dr. Sherron Monday on 10/12/2023 who presents today for follow up.   Today she reports significant improvement in her urinary symptoms following Botox.  She only wears pads occasionally now and her urge has resolved.  In-office UA today positive for trace leukocytes; urine microscopy with 6-10 WBCs/HPF and >10 epithelial cells/hpf. PVR 0mL.  PMH: Past Medical History:  Diagnosis Date   Breast cancer ([facility]CC) 1992   RIGHT MASTECTOMY   Thyroid cancer [facility])     Surgical History: Past Surgical History:  Procedure Laterality Date   BREAST EXCISIONAL BIOPSY Left    NEG   COLONOSCOPY WITH PROPOFOL N/A 07/30/2022   Procedure: COLONOSCOPY WITH PROPOFOL;  Surgeon: Toledo, Boykin Nearing, MD;  Location: [facility] ENDOSCOPY;  Service: Gastroenterology;  Laterality: N/A;   MASTECTOMY Right 1992   BREAST CA    Home Medications:  Allergies as of 10/28/2023       Reactions   Alendronate Nausea Only        Medication List        Accurate as of October 28, 2023 11:45 AM. If you have any questions, ask your nurse or doctor.          amLODipine 2.5 MG tablet Commonly known as: NORVASC Take 2.5 mg by mouth daily.   ciprofloxacin 500 MG tablet Commonly known as: CIPRO Take 1 tablet the day before Botox, 1 tablet the day of Botox and 1 tablet the day after Botox.   gabapentin 300 MG capsule Commonly known as: NEURONTIN Take 1 capsule by mouth at bedtime.   latanoprost 0.005 % ophthalmic solution Commonly known as: XALATAN Place 1 drop into both eyes at bedtime.   levothyroxine 88 MCG tablet Commonly known as: SYNTHROID Take by mouth.   nortriptyline 25 MG capsule Commonly known as: PAMELOR Take by mouth.    traZODone 50 MG tablet Commonly known as: DESYREL Take 50 mg by mouth at bedtime.        Allergies:  Allergies  Allergen Reactions   Alendronate Nausea Only    Family History: Family History  Problem Relation Age of Onset   Breast cancer Paternal Aunt     Social History:   reports that she has quit smoking. Her smoking use included cigarettes. She has never been exposed to tobacco smoke. She has never used smokeless tobacco. She reports current alcohol use. She reports that she does not use drugs.  Physical Exam: BP (!) 148/84   Pulse 89   Resp 16   Ht 4\' 11"  (1.499 m)   Wt 123 lb 9.6 oz (56.1 kg)   BMI 24.96 kg/m   Constitutional:  Alert and oriented, no acute distress, nontoxic appearing HEENT: [place], AT Cardiovascular: No clubbing, cyanosis, or edema Respiratory: Normal respiratory effort, no increased work of breathing Skin: No rashes, bruises or suspicious lesions Neurologic: Grossly intact, no focal deficits, moving all 4 extremities Psychiatric: Normal mood and affect  Laboratory Data: Results for orders placed or performed in visit on 10/28/23  Bladder Scan (Post Void Residual) in office   Collection Time: 10/28/23 11:26 AM  Result Value Ref Range   Scan Result 0 ML    Assessment & Plan:   1. Urgency incontinence (Primary) Symptoms  well-controlled following intravesical Botox.  UA is bland given contamination and she is emptying appropriately.  She prefers to schedule her next treatment and she would like to wait a little longer than 6 months, targeting October/November of this year. - Bladder Scan (Post Void Residual) in office - Urinalysis, Complete  Return in about 7 months (around 05/29/2024).  Carman Ching, PA-C  [facility] [place] [address], [address] [place], [place] [address] [phone]

## 2024-05-14 ENCOUNTER — Other Ambulatory Visit: Payer: Self-pay

## 2024-05-14 ENCOUNTER — Encounter (HOSPITAL_BASED_OUTPATIENT_CLINIC_OR_DEPARTMENT_OTHER): Payer: Self-pay

## 2024-05-14 ENCOUNTER — Emergency Department (HOSPITAL_BASED_OUTPATIENT_CLINIC_OR_DEPARTMENT_OTHER): Admission: EM | Admit: 2024-05-14 | Discharge: 2024-05-14 | Disposition: A | Discharge status: Home / Self Care

## 2024-05-14 DIAGNOSIS — R197 Diarrhea, unspecified: Secondary | ICD-10-CM | POA: Diagnosis present

## 2024-05-14 LAB — COMPREHENSIVE METABOLIC PANEL WITH GFR
ALT: 17 U/L (ref 0–44)
AST: 18 U/L (ref 15–41)
Albumin: 4.5 g/dL (ref 3.5–5.0)
Alkaline Phosphatase: 75 U/L (ref 38–126)
Anion gap: 11 (ref 5–15)
BUN: 19 mg/dL (ref 8–23)
CO2: 26 mmol/L (ref 22–32)
Calcium: 9.7 mg/dL (ref 8.9–10.3)
Chloride: 103 mmol/L (ref 98–111)
Creatinine, Ser: 0.78 mg/dL (ref 0.44–1.00)
GFR, Estimated: >60 mL/min (ref >60)
Glucose, Bld: 113 mg/dL — ABNORMAL HIGH (ref 70–99)
Potassium: 3.8 mmol/L (ref 3.5–5.1)
Sodium: 141 mmol/L (ref 135–145)
Total Bilirubin: 0.5 mg/dL (ref 0.0–1.2)
Total Protein: 6.7 g/dL (ref 6.5–8.1)

## 2024-05-14 LAB — CBC
HCT: 38.2 % (ref 36.0–46.0)
Hemoglobin: 13.1 g/dL (ref 12.0–15.0)
MCH: 35.7 pg — ABNORMAL HIGH (ref 26.0–34.0)
MCHC: 34.3 g/dL (ref 30.0–36.0)
MCV: 104.1 fL — ABNORMAL HIGH (ref 80.0–100.0)
Platelets: 213 K/uL (ref 150–400)
RBC: 3.67 MIL/uL — ABNORMAL LOW (ref 3.87–5.11)
RDW: 13 % (ref 11.5–15.5)
WBC: 6 K/uL (ref 4.0–10.5)
nRBC: 0 % (ref 0.0–0.2)

## 2024-05-14 LAB — URINALYSIS, ROUTINE W REFLEX MICROSCOPIC
Bilirubin Urine: NEGATIVE
Glucose, UA: NEGATIVE mg/dL
Hgb urine dipstick: NEGATIVE
Ketones, ur: NEGATIVE mg/dL
Leukocytes,Ua: NEGATIVE
Nitrite: POSITIVE — AB
Protein, ur: NEGATIVE mg/dL
Specific Gravity, Urine: 1.009 (ref 1.005–1.030)
pH: 6 (ref 5.0–8.0)

## 2024-05-14 LAB — LIPASE, BLOOD: Lipase: 23 U/L (ref 11–51)

## 2024-05-14 MED ORDER — LOPERAMIDE HCL 2 MG PO CAPS: 2.0000 mg | ORAL_CAPSULE | Freq: Four times a day (QID) | ORAL | 0 refills | Status: AC | PRN

## 2024-05-14 MED ORDER — CEPHALEXIN 500 MG PO CAPS: 500.0000 mg | ORAL_CAPSULE | Freq: Four times a day (QID) | ORAL | 0 refills | Status: DC

## 2024-05-14 NOTE — ED Provider Notes (Signed)
 [facility] AT [facility] Provider Note   CSN: [id number] Arrival date & time: 05/14/24  1019     Patient presents with: Diarrhea   Holly King is a 88 y.o. female.   This is an 88 year old female presenting emergency department for evaluation of diarrhea.  Reports that she has had diarrhea every day since May.  Described as watery, nonbloody.  Not having lightheadedness, dizziness, is not having abdominal pain, but does have some cramping before bowel movement.  Prior tubal ligation, but no other no abdominal surgeries.     Diarrhea      Prior to Admission medications   Medication Sig Start Date End Date Taking? Authorizing Provider  cephALEXin  (KEFLEX ) 500 MG capsule Take 1 capsule (500 mg total) by mouth 4 (four) times daily. 05/14/24  Yes Neysa Caron PARAS, DO  loperamide  (IMODIUM ) 2 MG capsule Take 1 capsule (2 mg total) by mouth 4 (four) times daily as needed for diarrhea or loose stools. 05/14/24  Yes Neysa Caron J, DO  amLODipine (NORVASC) 2.5 MG tablet Take 2.5 mg by mouth daily. 03/17/22   [provider]  ciprofloxacin  (CIPRO ) 500 MG tablet Take 1 tablet the day before Botox , 1 tablet the day of Botox  and 1 tablet the day after Botox . Patient not taking: Reported on 10/28/2023 09/01/23   MacDiarmid, Scott, MD  gabapentin (NEURONTIN) 300 MG capsule Take 1 capsule by mouth at bedtime. 01/23/22   [provider]  latanoprost (XALATAN) 0.005 % ophthalmic solution Place 1 drop into both eyes at bedtime.    [provider]  levothyroxine (SYNTHROID) 88 MCG tablet Take by mouth. 12/26/21   [provider]  nortriptyline (PAMELOR) 25 MG capsule Take by mouth. 09/29/23 09/28/24  [provider]  traZODone (DESYREL) 50 MG tablet Take 50 mg by mouth at bedtime. 07/11/22   [provider]    Allergies: Alendronate    Review of Systems  Gastrointestinal:  Positive for diarrhea.    Updated Vital  Signs BP (!) 173/80 (BP Location: Right Arm)   Pulse (!) 55   Temp 98.2 F (36.8 C) (Oral)   Resp 18   Ht 4' 11 (1.499 m)   Wt 55.3 kg   SpO2 100%   BMI 24.64 kg/m   Physical Exam Vitals and nursing note reviewed.  Constitutional:      General: She is not in acute distress.    Appearance: She is not toxic-appearing.  HENT:     Mouth/Throat:     Mouth: Mucous membranes are moist.  Eyes:     Conjunctiva/sclera: Conjunctivae normal.  Cardiovascular:     Rate and Rhythm: Normal rate and regular rhythm.     Pulses: Normal pulses.  Pulmonary:     Effort: Pulmonary effort is normal.  Abdominal:     General: Abdomen is flat. There is no distension.     Palpations: Abdomen is soft.     Tenderness: There is no abdominal tenderness. There is no guarding or rebound.  Musculoskeletal:        General: Normal range of motion.  Skin:    General: Skin is warm and dry.     Capillary Refill: Capillary refill takes less than 2 seconds.  Neurological:     Mental Status: She is alert and oriented to person, place, and time.  Psychiatric:        Mood and Affect: Mood normal.        Behavior: Behavior normal.     (  all labs ordered are listed, but only abnormal results are displayed) Labs Reviewed  COMPREHENSIVE METABOLIC PANEL WITH GFR - Abnormal; Notable for the following components:      Result Value   Glucose, Bld 113 (*)    All other components within normal limits  CBC - Abnormal; Notable for the following components:   RBC 3.67 (*)    MCV 104.1 (*)    MCH 35.7 (*)    All other components within normal limits  URINALYSIS, ROUTINE W REFLEX MICROSCOPIC - Abnormal; Notable for the following components:   Nitrite POSITIVE (*)    Bacteria, UA MANY (*)    All other components within normal limits  LIPASE, BLOOD    EKG: None  Radiology: No results found.   Procedures   Medications Ordered in the ED - No data to display                                  Medical  Decision Making This is an 88 year old female presenting emergency department for subacute/chronic diarrhea.  She is afebrile nontachycardic, slightly hypertensive.  Clinically well-appearing without evidence of dehydration.  Benign abdominal exam.  Lab workup also reassuring.  No leukocytosis to suggest infectious process.  No anemia to suggest GI hemorrhage.  No metabolic derangements.  Normal kidney function.  No transaminitis or elevated bilirubin to suggest hepatobiliary disease.  Lipase normal.  Pancreatitis unlikely or pancreatic insufficiency unlikely.  Her urine does have evidence of UTI.  Per chart review had colonoscopy 2 years ago which was normal.  She has a follow-up appointment with GI on October 2 per friend who is in the room.  I offered CT scan to evaluate for malignant or obstructive process, but patient declined and would like to follow-up with her gastroenterologist.  Discussed increasing her fiber.  Will try loperamide .  Stable for discharge  Amount and/or Complexity of Data Reviewed Labs: ordered.  Risk Prescription drug management.       Final diagnoses:  Diarrhea, unspecified type    ED Discharge Orders          Ordered    loperamide  (IMODIUM ) 2 MG capsule  4 times daily PRN        05/14/24 1215    cephALEXin  (KEFLEX ) 500 MG capsule  4 times daily        05/14/24 1215               Neysa Caron PARAS, DO 05/14/24 1538

## 2024-05-14 NOTE — ED Triage Notes (Signed)
 Pt complaining of having diarrhea every day since May. Has gotten somewhat worse.

## 2024-05-14 NOTE — Discharge Instructions (Signed)
 Please follow-up with your primary doctor and your gastroenterologist.  May try the loperamide  that we prescribed you for diarrhea.  We also prescribed you antibiotic for your urinary tract infection.  Return if felt fevers, chills, lightheadedness, passout, chest pain, shortness of breath, abdominal pain, blood in your stool or any new or worsening symptoms that are concerning to you.

## 2024-05-24 ENCOUNTER — Other Ambulatory Visit: Payer: Self-pay

## 2024-05-24 DIAGNOSIS — N3941 Urge incontinence: Secondary | ICD-10-CM

## 2024-06-07 ENCOUNTER — Other Ambulatory Visit: Payer: Self-pay

## 2024-06-07 NOTE — Addendum Note (Signed)
 Addended byBETHA CORIE PLATER on: 06/07/2024 09:26 AM   Modules accepted: Orders

## 2024-06-20 ENCOUNTER — Other Ambulatory Visit

## 2024-06-20 DIAGNOSIS — N3941 Urge incontinence: Secondary | ICD-10-CM

## 2024-06-20 LAB — URINALYSIS, COMPLETE
Bilirubin, UA: NEGATIVE
Glucose, UA: NEGATIVE
Ketones, UA: NEGATIVE
Leukocytes,UA: NEGATIVE
Nitrite, UA: NEGATIVE
Protein,UA: NEGATIVE
Specific Gravity, UA: 1.015 (ref 1.005–1.030)
Urobilinogen, Ur: 0.2 mg/dL (ref 0.2–1.0)
pH, UA: 6 (ref 5.0–7.5)

## 2024-06-20 LAB — MICROSCOPIC EXAMINATION

## 2024-06-21 ENCOUNTER — Other Ambulatory Visit: Payer: Self-pay | Admitting: Family Medicine

## 2024-06-21 DIAGNOSIS — Z1231 Encounter for screening mammogram for malignant neoplasm of breast: Secondary | ICD-10-CM

## 2024-06-23 LAB — CULTURE, URINE COMPREHENSIVE

## 2024-07-01 ENCOUNTER — Telehealth: Payer: Self-pay

## 2024-07-01 DIAGNOSIS — N3946 Mixed incontinence: Secondary | ICD-10-CM

## 2024-07-01 DIAGNOSIS — N3941 Urge incontinence: Secondary | ICD-10-CM

## 2024-07-01 MED ORDER — CIPROFLOXACIN HCL 500 MG PO TABS: 500.0000 mg | ORAL_TABLET | Freq: Every day | ORAL | 0 refills | Status: AC

## 2024-07-01 NOTE — Telephone Encounter (Signed)
 Called pt to let them know that Cipro  will be sent to their pharmacy, this is to be taken, the day before the procedure, the day of the procedure and the day after the procedure. Also, to inform them to please come 30 minutes early to their appointment so that we can numb their bladder with a local anesthetic and wait for it to take effect. Pt voiced understanding.

## 2024-07-04 ENCOUNTER — Ambulatory Visit (INDEPENDENT_AMBULATORY_CARE_PROVIDER_SITE_OTHER): Admitting: Urology

## 2024-07-04 VITALS — BP 173/83 | HR 77 | Ht 59.0 in | Wt 125.0 lb

## 2024-07-04 DIAGNOSIS — N3941 Urge incontinence: Secondary | ICD-10-CM | POA: Diagnosis not present

## 2024-07-04 DIAGNOSIS — N3946 Mixed incontinence: Secondary | ICD-10-CM

## 2024-07-04 MED ORDER — ONABOTULINUMTOXINA 100 UNITS IJ SOLR
100.0000 [IU] | Freq: Once | INTRAMUSCULAR | Status: AC
  Administered 2024-07-04: 100 [IU] via INTRAMUSCULAR

## 2024-07-04 MED ORDER — ONABOTULINUMTOXINA 100 UNITS IJ SOLR: 100.0000 [IU] | Freq: Once | INTRAMUSCULAR | Status: DC

## 2024-07-04 NOTE — Progress Notes (Signed)
 Bladder Instillation  Due to botox  injections patient is present today for a Bladder Instillation of 2% lidocaine . Patient was cleaned and prepped in a sterile fashion with betadine and lidocaine  2% jelly was instilled into the urethra.  A 14FR catheter was inserted, urine return was noted 25 ml, urine was yellow in color.  60 ml was instilled into the bladder. The catheter was then removed. Patient tolerated well, no complications were noted. Patient held in bladder for 30 minutes prior to procedure starting.   Performed by: Tyrus Buhl, RMA  Follow up/ Additional notes: 2 week follow up

## 2024-07-04 NOTE — Progress Notes (Signed)
 07/04/2024 10:55 AM   Holly King 1936-06-13 [id number]  Referring provider: Diedra Lame, MD [phone] Holly King [facility] [place],  [place] [address]  No chief complaint on file.   HPI: Reviewed chart.  Patient has had Streptococcus in the urine treated with Keflex  in the past.  She has mixed incontinence but primarily refractory overactive bladder.  Her last Botox  was in March 2024.  She did very well and empty efficiently afterwards.  Recent urine culture negative   Clinically not infected.  Took her ciprofloxacin .  She had more than 6 months of excellent results from the Botox .  Frequency stable.  Urge incontinence returned   Cystoscopy: Patient underwent flexible cystoscopy.  Bladder mucosa and trigone were normal.  No cystitis.  No carcinoma.  Clinically not infected.  I injected 100 units of Botox  and 10 cc of normal saline with my modified template with more injections in the middle but also in the lower half of the bladder.  She tolerated well.  No bleeding    Today Patient had Botox  February 2025.  Frequency stable. Urge stable.  Clinically not infected I injected 100 units of Botox  and 10 cc of normal saline with a modified template.  I injected in the posterior wall.  1 cc/inj.  She tolerated very well.  Follow-up as per protocol  PMH: Past Medical History:  Diagnosis Date   Breast cancer (HCC) 1992   RIGHT MASTECTOMY   Thyroid cancer [facility])     Surgical History: Past Surgical History:  Procedure Laterality Date   BREAST EXCISIONAL BIOPSY Left    NEG   COLONOSCOPY WITH PROPOFOL  N/A 07/30/2022   Procedure: COLONOSCOPY WITH PROPOFOL ;  Surgeon: Toledo, Ladell POUR, MD;  Location: [facility] ENDOSCOPY;  Service: Gastroenterology;  Laterality: N/A;   MASTECTOMY Right 1992   BREAST CA    Home Medications:  Allergies as of 07/04/2024       Reactions   Alendronate Nausea Only        Medication List        Accurate  as of July 04, 2024 10:55 AM. If you have any questions, ask your nurse or doctor.          amLODipine 2.5 MG tablet Commonly known as: NORVASC Take 2.5 mg by mouth daily.   ciprofloxacin  500 MG tablet Commonly known as: Cipro  Take 1 tablet (500 mg total) by mouth daily for 3 days. Take 1 tablet the day before Botox , 1 tablet the day of Botox  and 1 tablet the day after Botox    gabapentin 300 MG capsule Commonly known as: NEURONTIN Take 1 capsule by mouth at bedtime.   latanoprost 0.005 % ophthalmic solution Commonly known as: XALATAN Place 1 drop into both eyes at bedtime.   levothyroxine 88 MCG tablet Commonly known as: SYNTHROID Take by mouth.   loperamide  2 MG capsule Commonly known as: IMODIUM  Take 1 capsule (2 mg total) by mouth 4 (four) times daily as needed for diarrhea or loose stools.   nortriptyline 25 MG capsule Commonly known as: PAMELOR Take by mouth.   traZODone 50 MG tablet Commonly known as: DESYREL Take 50 mg by mouth at bedtime.        Allergies:  Allergies  Allergen Reactions   Alendronate Nausea Only    Family History: Family History  Problem Relation Age of Onset   Breast cancer Paternal Aunt     Social History:  reports that she has quit smoking.  Her smoking use included cigarettes. She has never been exposed to tobacco smoke. She has never used smokeless tobacco. She reports current alcohol use. She reports that she does not use drugs.  ROS:                                        Physical Exam: There were no vitals taken for this visit.  Constitutional:  Alert and oriented, No acute distress. HEENT: [place] AT, moist mucus membranes.  Trachea midline, no masses.   Laboratory Data: Lab Results  Component Value Date   WBC 6.0 05/14/2024   HGB 13.1 05/14/2024   HCT 38.2 05/14/2024   MCV 104.1 (H) 05/14/2024   PLT 213 05/14/2024    Lab Results  Component Value Date   CREATININE 0.78 05/14/2024     No results found for: PSA  No results found for: TESTOSTERONE  No results found for: HGBA1C  Urinalysis    Component Value Date/Time   COLORURINE YELLOW 05/14/2024 1039   APPEARANCEUR Clear 06/20/2024 1106   LABSPEC 1.009 05/14/2024 1039   PHURINE 6.0 05/14/2024 1039   GLUCOSEU Negative 06/20/2024 1106   HGBUR NEGATIVE 05/14/2024 1039   BILIRUBINUR Negative 06/20/2024 1106   KETONESUR NEGATIVE 05/14/2024 1039   PROTEINUR Negative 06/20/2024 1106   PROTEINUR NEGATIVE 05/14/2024 1039   NITRITE Negative 06/20/2024 1106   NITRITE POSITIVE (A) 05/14/2024 1039   LEUKOCYTESUR Negative 06/20/2024 1106   LEUKOCYTESUR NEGATIVE 05/14/2024 1039    Pertinent Imaging:   Assessment & Plan: Follow up protocol  1. Urgency incontinence (Primary)   2. Mixed incontinence    No follow-ups on file.  [name], MD  [facility] [address], [address] [place], [place] [address] [phone]

## 2024-07-18 ENCOUNTER — Ambulatory Visit: Admitting: Physician Assistant

## 2024-07-20 ENCOUNTER — Ambulatory Visit
Admission: RE | Admit: 2024-07-20 | Discharge: 2024-07-20 | Disposition: A | Discharge status: Home / Self Care | Source: Ambulatory Visit | Attending: Family Medicine | Admitting: Family Medicine

## 2024-07-20 DIAGNOSIS — Z1231 Encounter for screening mammogram for malignant neoplasm of breast: Secondary | ICD-10-CM | POA: Insufficient documentation

## 2024-07-29 ENCOUNTER — Ambulatory Visit: Admitting: Physician Assistant

## 2024-07-29 VITALS — BP 167/85 | HR 56 | Ht 59.0 in | Wt 124.0 lb

## 2024-07-29 DIAGNOSIS — N3941 Urge incontinence: Secondary | ICD-10-CM | POA: Diagnosis not present

## 2024-07-29 LAB — BLADDER SCAN AMB NON-IMAGING

## 2024-07-29 NOTE — Progress Notes (Signed)
 07/29/2024 10:09 AM   Holly King November 13, 1935 [id number]  CC: Chief Complaint  Patient presents with   Follow-up   Urinary Incontinence   HPI: Holly King is a 88 y.o. female with PMH OAB wet with mixed incontinence who underwent intravesical Botox  with Dr. Gaston on 07/04/2024 who presents today for follow-up.   Today she reports she is doing exceedingly well after intravesical Botox  with no concerns.  She is dry.  She would like to continue with Botox  every 6 months.  She is unable to provide a urine specimen today but denies dysuria.  Bladder scan 155 mL, last void about 2 hours prior.  PMH: Past Medical History:  Diagnosis Date   Breast cancer (HCC) 1992   RIGHT MASTECTOMY   Thyroid cancer [facility])     Surgical History: Past Surgical History:  Procedure Laterality Date   BREAST EXCISIONAL BIOPSY Left    NEG   COLONOSCOPY WITH PROPOFOL  N/A 07/30/2022   Procedure: COLONOSCOPY WITH PROPOFOL ;  Surgeon: Toledo, Ladell POUR, MD;  Location: [facility] ENDOSCOPY;  Service: Gastroenterology;  Laterality: N/A;   MASTECTOMY Right 1992   BREAST CA    Home Medications:  Allergies as of 07/29/2024       Reactions   Alendronate Nausea Only        Medication List        Accurate as of July 29, 2024 10:09 AM. If you have any questions, ask your nurse or doctor.          STOP taking these medications    dicyclomine 10 MG capsule Commonly known as: BENTYL   nortriptyline 25 MG capsule Commonly known as: PAMELOR       TAKE these medications    ALPRAZolam 0.25 MG tablet Commonly known as: XANAX Take 0.25 mg by mouth 2 (two) times daily as needed.   amLODipine 2.5 MG tablet Commonly known as: NORVASC Take 2.5 mg by mouth daily.   gabapentin 300 MG capsule Commonly known as: NEURONTIN Take 1 capsule by mouth at bedtime.   latanoprost 0.005 % ophthalmic solution Commonly known as: XALATAN Place 1 drop into both eyes at bedtime.    levothyroxine 88 MCG tablet Commonly known as: SYNTHROID Take by mouth.   loperamide  2 MG capsule Commonly known as: IMODIUM  Take 1 capsule (2 mg total) by mouth 4 (four) times daily as needed for diarrhea or loose stools.   losartan-hydrochlorothiazide 50-12.5 MG tablet Commonly known as: HYZAAR Take 1 tablet by mouth daily.   traZODone 50 MG tablet Commonly known as: DESYREL Take 50 mg by mouth at bedtime.        Allergies:  Allergies  Allergen Reactions   Alendronate Nausea Only    Family History: Family History  Problem Relation Age of Onset   Breast cancer Paternal Aunt     Social History:   reports that she has quit smoking. Her smoking use included cigarettes. She has never been exposed to tobacco smoke. She has never used smokeless tobacco. She reports current alcohol use. She reports that she does not use drugs.  Physical Exam: BP (!) 167/85 (BP Location: Left Arm, Patient Position: Sitting, Cuff Size: Large)   Pulse (!) 56   Ht 4' 11 (1.499 m)   Wt 124 lb (56.2 kg)   SpO2 98%   BMI 25.04 kg/m   Constitutional:  Alert and oriented, no acute distress, nontoxic appearing HEENT: [place], AT Cardiovascular: No clubbing, cyanosis, or edema Respiratory: Normal respiratory effort, no increased  work of breathing Skin: No rashes, bruises or suspicious lesions Neurologic: Grossly intact, no focal deficits, moving all 4 extremities Psychiatric: Normal mood and affect  Laboratory Data: Results for orders placed or performed in visit on 07/29/24  Bladder Scan (Post Void Residual) in office   Collection Time: 07/29/24 10:03 AM  Result Value Ref Range   Scan Result    Assessment & Plan:   1. Urgency incontinence (Primary) Not clinically infected, emptying well, excellent symptom control.  Will get her set up for her next treatment in 6 months.  She is in agreement. - Bladder Scan (Post Void Residual) in office   Return for Will call to schedule 93-month  Botox .  Lucie Hones, PA-C  [facility] [place] [address] [place], [place] [address] [phone]
